# Patient Record
Sex: Male | Born: 1945 | Race: Black or African American | Hispanic: No | Marital: Single | Smoking: Former smoker
Health system: Southern US, Community
[De-identification: ages and names within clinical notes are randomized; demographics above are authoritative.]

## PROBLEM LIST (undated history)

## (undated) DIAGNOSIS — R972 Elevated prostate specific antigen [PSA]: Secondary | ICD-10-CM

## (undated) DIAGNOSIS — I10 Essential (primary) hypertension: Secondary | ICD-10-CM

## (undated) DIAGNOSIS — K219 Gastro-esophageal reflux disease without esophagitis: Secondary | ICD-10-CM

## (undated) DIAGNOSIS — N401 Enlarged prostate with lower urinary tract symptoms: Secondary | ICD-10-CM

## (undated) DIAGNOSIS — E119 Type 2 diabetes mellitus without complications: Secondary | ICD-10-CM

## (undated) DIAGNOSIS — R339 Retention of urine, unspecified: Secondary | ICD-10-CM

## (undated) HISTORY — DX: Gastro-esophageal reflux disease without esophagitis: K21.9

## (undated) HISTORY — DX: Retention of urine, unspecified: R33.9

## (undated) HISTORY — DX: Benign prostatic hyperplasia with lower urinary tract symptoms: N40.1

## (undated) HISTORY — DX: Elevated prostate specific antigen (PSA): R97.20

## (undated) HISTORY — DX: Essential (primary) hypertension: I10

---

## 2008-12-13 HISTORY — PX: HAMMER TOE SURGERY: SHX385

## 2014-10-31 ENCOUNTER — Emergency Department: Payer: Self-pay | Admitting: Emergency Medicine

## 2014-10-31 LAB — COMPREHENSIVE METABOLIC PANEL
ALT: 22 U/L
ANION GAP: 8 (ref 7–16)
Albumin: 3.3 g/dL — ABNORMAL LOW (ref 3.4–5.0)
Alkaline Phosphatase: 83 U/L
BUN: 15 mg/dL (ref 7–18)
Bilirubin,Total: 0.6 mg/dL (ref 0.2–1.0)
CHLORIDE: 108 mmol/L — AB (ref 98–107)
Calcium, Total: 9.5 mg/dL (ref 8.5–10.1)
Co2: 24 mmol/L (ref 21–32)
Creatinine: 1.21 mg/dL (ref 0.60–1.30)
EGFR (Non-African Amer.): >60
Glucose: 125 mg/dL — ABNORMAL HIGH (ref 65–99)
POTASSIUM: 4.1 mmol/L (ref 3.5–5.1)
SGOT(AST): 22 U/L (ref 15–37)
Sodium: 140 mmol/L (ref 136–145)
Total Protein: 7.2 g/dL (ref 6.4–8.2)

## 2014-10-31 LAB — CBC
HCT: 50.7 % (ref 40.0–52.0)
HGB: 17 g/dL (ref 13.0–18.0)
MCH: 31.6 pg (ref 26.0–34.0)
MCHC: 33.4 g/dL (ref 32.0–36.0)
MCV: 95 fL (ref 80–100)
PLATELETS: 219 10*3/uL (ref 150–440)
RBC: 5.36 10*6/uL (ref 4.40–5.90)
RDW: 13.5 % (ref 11.5–14.5)
WBC: 6.1 10*3/uL (ref 3.8–10.6)

## 2014-10-31 LAB — TROPONIN I: Troponin-I: <0.02 ng/mL

## 2014-12-13 ENCOUNTER — Emergency Department: Payer: Self-pay | Admitting: Emergency Medicine

## 2014-12-13 LAB — COMPREHENSIVE METABOLIC PANEL
ALBUMIN: 4.1 g/dL (ref 3.4–5.0)
ALT: 29 U/L
ANION GAP: 10 (ref 7–16)
AST: 26 U/L (ref 15–37)
Alkaline Phosphatase: 79 U/L
BILIRUBIN TOTAL: 0.8 mg/dL (ref 0.2–1.0)
BUN: 10 mg/dL (ref 7–18)
Calcium, Total: 9.5 mg/dL (ref 8.5–10.1)
Chloride: 102 mmol/L (ref 98–107)
Co2: 24 mmol/L (ref 21–32)
Creatinine: 0.96 mg/dL (ref 0.60–1.30)
EGFR (Non-African Amer.): >60
GLUCOSE: 109 mg/dL — AB (ref 65–99)
Osmolality: 272 (ref 275–301)
POTASSIUM: 3.9 mmol/L (ref 3.5–5.1)
Sodium: 136 mmol/L (ref 136–145)
TOTAL PROTEIN: 7.6 g/dL (ref 6.4–8.2)

## 2014-12-13 LAB — URINALYSIS, COMPLETE
BACTERIA: NONE SEEN
Bilirubin,UR: NEGATIVE
Leukocyte Esterase: NEGATIVE
NITRITE: NEGATIVE
Ph: 5 (ref 4.5–8.0)
Specific Gravity: 1.012 (ref 1.003–1.030)
Squamous Epithelial: NONE SEEN
WBC UR: 1 /HPF (ref 0–5)

## 2014-12-13 LAB — CBC
HCT: 48 % (ref 40.0–52.0)
HGB: 15.8 g/dL (ref 13.0–18.0)
MCH: 30.8 pg (ref 26.0–34.0)
MCHC: 32.8 g/dL (ref 32.0–36.0)
MCV: 94 fL (ref 80–100)
Platelet: 217 10*3/uL (ref 150–440)
RBC: 5.11 10*6/uL (ref 4.40–5.90)
RDW: 13.1 % (ref 11.5–14.5)
WBC: 8 10*3/uL (ref 3.8–10.6)

## 2015-05-21 ENCOUNTER — Encounter: Payer: Self-pay | Admitting: *Deleted

## 2015-05-22 ENCOUNTER — Ambulatory Visit (INDEPENDENT_AMBULATORY_CARE_PROVIDER_SITE_OTHER): Payer: Medicare Other | Admitting: Urology

## 2015-05-22 ENCOUNTER — Encounter: Payer: Self-pay | Admitting: Urology

## 2015-05-22 VITALS — BP 149/86 | HR 86 | Ht 73.0 in | Wt 174.5 lb

## 2015-05-22 DIAGNOSIS — N401 Enlarged prostate with lower urinary tract symptoms: Secondary | ICD-10-CM | POA: Diagnosis not present

## 2015-05-22 DIAGNOSIS — R972 Elevated prostate specific antigen [PSA]: Secondary | ICD-10-CM

## 2015-05-22 DIAGNOSIS — N138 Other obstructive and reflux uropathy: Secondary | ICD-10-CM

## 2015-05-22 NOTE — Progress Notes (Signed)
05/22/2015 7:16 PM   Xavier Moore [birth year] [id number]  Referring provider: No referring provider defined for this encounter.  Chief Complaint  Patient presents with  . Benign Prostatic Hypertrophy    Here to discuss having a prostate biopsy    HPI: [name] is a 69 year old African-American male with an elevated PSA. Patient presented to [place] in February after being seen in the emergency department for urinary retention. He had indwelling Foley placed during his ER visit. It was removed in our office for a voiding trial he was started on Flomax. He was experiencing urinary frequency, urgency, weak stream and nocturia, getting up 3 times per night before the episode of retention. He states since he started the Flomax he has frequency going to the bathroom every 3 hours while awake and nocturia once per night. Patient had a PSA drawn on 01/13/2015 and returned 18.0 ng/mL. The elevated PSA may have been the result of his episode of urinary retention, so he was asked to return in 3 months to have his PSA repeated. He returned on 04/21/2015 and his PSA was 12.3 ng/mL at that time. He is also found to have an irregular prostate on exam.  He presents today to discuss prostate biopsy. I will repeat PSA and DRE today.     PMH: Past Medical History  Diagnosis Date  . GERD (gastroesophageal reflux disease)   . Hypertension   . Urinary retention   . Benign prostatic hypertrophy with lower urinary tract symptoms (LUTS)   . Elevated PSA     Surgical History: Past Surgical History  Procedure Laterality Date  . Hammer toe surgery  2010    Home Medications:    Medication List       This list is accurate as of: 05/22/15 11:59 PM.  Always use your most recent med list.               lisinopril 20 MG tablet  Commonly known as:  PRINIVIL,ZESTRIL  Take 20 mg by mouth daily.     omeprazole 20 MG capsule  Commonly known as:  PRILOSEC  Take 20 mg by mouth daily.     tamsulosin  0.4 MG Caps capsule  Commonly known as:  FLOMAX  Take 0.4 mg by mouth daily.        Allergies: No Known Allergies  Family History: Family History  Problem Relation Age of Onset  . Cancer Neg Hx     Kidney,Prostate, Bladder    Social History:  reports that he has quit smoking. He does not have any smokeless tobacco history on file. He reports that he drinks alcohol. He reports that he does not use illicit drugs.  ROS: Urological Symptom Review  Patient is experiencing the following symptoms: Frequent urination Get up at night to urinate Leakage of urine Weak stream   Review of Systems  Gastrointestinal (upper)  : Negative for upper GI symptoms  Gastrointestinal (lower) : Negative for lower GI symptoms  Constitutional : Negative for symptoms  Skin: Negative for skin symptoms  Eyes: Negative for eye symptoms  Ear/Nose/Throat : Negative for Ear/Nose/Throat symptoms  Hematologic/Lymphatic: Negative for Hematologic/Lymphatic symptoms  Cardiovascular : Negative for cardiovascular symptoms  Respiratory : Negative for respiratory symptoms  Endocrine: Negative for endocrine symptoms  Musculoskeletal: Negative for musculoskeletal symptoms  Neurological: Negative for neurological symptoms  Psychologic: Negative for psychiatric symptoms   Physical Exam: BP 149/86 mmHg  Pulse 86  Ht 6\' 1"  (1.854 m)  Wt 174 lb  8 oz (79.153 kg)  BMI 23.03 kg/m2   Rectal: Patient with  normal sphincter tone. Perineum without scarring or rashes. No rectal masses are appreciated. Prostate is approximately 55 grams, with rubbery nodule.  Seminal vesicles are normal.   Laboratory Data: Results for orders placed or performed in visit on 05/22/15  PSA  Result Value Ref Range   Prostate Specific Ag, Serum 14.8 (H) 0.0 - 4.0 ng/mL   Lab Results  Component Value Date   WBC 8.0 12/13/2014   HGB 15.8 12/13/2014   HCT 48.0 12/13/2014   MCV 94 12/13/2014   PLT 217  12/13/2014    Lab Results  Component Value Date   CREATININE 0.96 12/13/2014    No results found for: PSA  No results found for: TESTOSTERONE  No results found for: HGBA1C  Urinalysis No results found for: COLORURINE, APPEARANCEUR, LABSPEC, PHURINE, GLUCOSEU, HGBUR, BILIRUBINUR, KETONESUR, PROTEINUR, UROBILINOGEN, NITRITE, LEUKOCYTESUR  Pertinent Imaging:  Assessment & Plan:   1. Elevated PSA-  If PSA remains elevated, patient will be schedule for a TRUSPBx of prostate.  The procedure is explained and the risks involved, such as blood in urine, blood in stool, blood in semen, infection, urinary retention, and on rare occasions sepsis and death.  Patient understands the risks as explained to him and he wishes to proceed.  - PSA  2. BPH with LUTS-  Patient's lower urinary tract symptoms are well-controlled with tamsulosin 0.4 mg 1 by mouth daily.   He will continue that medication.  If prostate biopsy is negative, he will follow up IPSS and PVR.Marland Kitchen   No Follow-up on file.  Michiel Cowboy, PA-C  [facility] [address] [place], [place] [address] [phone]

## 2015-05-23 ENCOUNTER — Telehealth: Payer: Self-pay

## 2015-05-23 LAB — PSA: PROSTATE SPECIFIC AG, SERUM: 14.8 ng/mL — AB (ref 0.0–4.0)

## 2015-05-23 NOTE — Telephone Encounter (Signed)
-----   Message from Harle Battiest, PA-C sent at 05/23/2015  8:49 AM EDT ----- PSA has not changed.  We need to proceed with the TRUSPBx of prostate.  Confirm that he is not taking any blood thinners.

## 2015-05-23 NOTE — Telephone Encounter (Signed)
Spoke with pt who agreed to prostate bx. Pt also stated he is not taking any blood thinners. Attempted to set pt up for prostate bx. Pt c/o running up minutes of cell phone and stated he would call back this afternoon to set up bx appt. Cw,lpn

## 2015-05-23 NOTE — Telephone Encounter (Signed)
-----   Message from Harle Battiest, PA-C sent at 05/23/2015  8:52 AM EDT ----- Scheduled a TRUSPBx of prostate.  Confirm he is not on blood thinners.

## 2015-05-25 DIAGNOSIS — R972 Elevated prostate specific antigen [PSA]: Secondary | ICD-10-CM | POA: Insufficient documentation

## 2015-05-25 DIAGNOSIS — N401 Enlarged prostate with lower urinary tract symptoms: Principal | ICD-10-CM

## 2015-05-25 DIAGNOSIS — N138 Other obstructive and reflux uropathy: Secondary | ICD-10-CM | POA: Insufficient documentation

## 2015-05-26 ENCOUNTER — Telehealth: Payer: Self-pay

## 2015-05-26 NOTE — Telephone Encounter (Signed)
Pt came into the office with questions and  Concerns regarding prostate bx. All questions were answered. Pt scheduled prostate bx prior to leaving facility. Cw,lpn

## 2015-06-27 ENCOUNTER — Ambulatory Visit: Payer: Medicare Other | Admitting: Urology

## 2015-06-27 VITALS — Ht 73.0 in | Wt 175.4 lb

## 2015-06-27 MED ORDER — LEVOFLOXACIN 500 MG PO TABS: 500.0000 mg | ORAL_TABLET | Freq: Once | ORAL | Status: DC

## 2015-06-27 MED ORDER — GENTAMICIN SULFATE 40 MG/ML IJ SOLN
80.0000 mg | Freq: Once | INTRAMUSCULAR | Status: DC
Start: 2015-06-27 — End: 2015-07-22

## 2015-07-08 ENCOUNTER — Ambulatory Visit: Payer: Medicare Other | Admitting: Urology

## 2015-07-22 ENCOUNTER — Ambulatory Visit (INDEPENDENT_AMBULATORY_CARE_PROVIDER_SITE_OTHER): Payer: Medicare Other | Admitting: Urology

## 2015-07-22 ENCOUNTER — Other Ambulatory Visit: Payer: Self-pay | Admitting: Urology

## 2015-07-22 VITALS — Ht 73.0 in | Wt 177.9 lb

## 2015-07-22 DIAGNOSIS — R972 Elevated prostate specific antigen [PSA]: Secondary | ICD-10-CM

## 2015-07-22 MED ORDER — GENTAMICIN SULFATE 40 MG/ML IJ SOLN
80.0000 mg | Freq: Once | INTRAMUSCULAR | Status: AC
  Administered 2015-07-22: 80 mg via INTRAMUSCULAR

## 2015-07-22 MED ORDER — LIDOCAINE HCL 2 % EX GEL
1.0000 "application " | Freq: Once | CUTANEOUS | Status: AC
  Administered 2015-07-22: 1 via URETHRAL

## 2015-07-22 MED ORDER — LEVOFLOXACIN 500 MG PO TABS
500.0000 mg | ORAL_TABLET | Freq: Once | ORAL | Status: AC
  Administered 2015-07-22: 500 mg via ORAL

## 2015-07-22 NOTE — Progress Notes (Signed)
Prostate Biopsy Procedure   Informed consent was obtained after discussing risks/benefits of the procedure.  A time out was performed to ensure correct patient identity.  Pre-Procedure: - Last PSA Level:  Lab Results  Component Value Date   PSA 14.8* 05/22/2015   - Gentamicin given prophylactically - Levaquin 500 mg administered PO -Transrectal Ultrasound performed revealing a 56 gm prostate - Large adenomas within transitional zone noted, small hypoechoic area on left apex.  Small median lobe.  Procedure: - Prostate block performed using 10 cc 1% lidocaine and biopsies taken from sextant areas, a total of 12 under ultrasound guidance.  Post-Procedure: - Patient tolerated the procedure well - He was counseled to seek immediate medical attention if experiences any severe pain, significant bleeding, or fevers - Return in one week to discuss biopsy results

## 2015-07-25 ENCOUNTER — Other Ambulatory Visit: Payer: Self-pay | Admitting: Urology

## 2015-07-25 LAB — PATHOLOGY REPORT

## 2016-01-26 ENCOUNTER — Encounter: Payer: Self-pay | Admitting: Urology

## 2016-01-26 ENCOUNTER — Ambulatory Visit: Payer: Medicare Other | Admitting: Urology

## 2016-02-05 ENCOUNTER — Encounter: Payer: Self-pay | Admitting: Urology

## 2016-02-05 ENCOUNTER — Ambulatory Visit (INDEPENDENT_AMBULATORY_CARE_PROVIDER_SITE_OTHER): Payer: Medicare Other | Admitting: Urology

## 2016-02-05 VITALS — BP 123/79 | HR 76 | Ht 73.0 in | Wt 174.6 lb

## 2016-02-05 DIAGNOSIS — R3989 Other symptoms and signs involving the genitourinary system: Secondary | ICD-10-CM

## 2016-02-05 DIAGNOSIS — N138 Other obstructive and reflux uropathy: Secondary | ICD-10-CM

## 2016-02-05 DIAGNOSIS — R972 Elevated prostate specific antigen [PSA]: Secondary | ICD-10-CM

## 2016-02-05 DIAGNOSIS — Z87898 Personal history of other specified conditions: Secondary | ICD-10-CM

## 2016-02-05 DIAGNOSIS — N528 Other male erectile dysfunction: Secondary | ICD-10-CM | POA: Diagnosis not present

## 2016-02-05 DIAGNOSIS — N401 Enlarged prostate with lower urinary tract symptoms: Secondary | ICD-10-CM | POA: Diagnosis not present

## 2016-02-05 DIAGNOSIS — N529 Male erectile dysfunction, unspecified: Secondary | ICD-10-CM

## 2016-02-05 DIAGNOSIS — Z87448 Personal history of other diseases of urinary system: Secondary | ICD-10-CM | POA: Diagnosis not present

## 2016-02-05 LAB — BLADDER SCAN AMB NON-IMAGING: Scan Result: 24

## 2016-02-05 MED ORDER — FINASTERIDE 5 MG PO TABS: 5.0000 mg | ORAL_TABLET | Freq: Every day | ORAL | Status: DC

## 2016-02-05 NOTE — Progress Notes (Signed)
10:14 AM   Xavier Moore [birth year] [id number]  Referring provider: Oswaldo Conroy, MD [address] [place], [place] [address]  Chief Complaint  Patient presents with  . Benign Prostatic Hypertrophy    6 month follow up    HPI: Patient is a 70 year old African-American male with a history of elevated PSA, irregular prostate exam, history of urinary retention, BPH with LUTS and ED who presents today for a 6 month follow up.   History of elevated PSA Patient was found to have an elevated PSA of 18.0 after an episode of urinary retention in 01/2015.  His PSA did not reduce significantly after repeated blood tests and he underwent a biopsy on 07/22/2015 for a PSA of 14.8 with Dr. Marlou Porch.  The biopsy did not demonstrate any malignancy.    Irregular prostate exam Patient was found to have a rubbery nodule in his left lobe of his prostate.  It has remained unchanged.  He did undergo a biopsy in 07/2015 and no malignancy was found.  History of urinary retention Patient had an episode of acute urinary retention in 01/2015.  He had a foley in place for bladder decompression and started on tamsulosin 0.4 mg daily.  He has been voiding successfully since that time.  His PVR today is 24 mL.    BPH WITH LUTS His IPSS score today is 12, which is moderate lower urinary tract symptomatology. He is mixed with his quality life due to his urinary symptoms. His PVR is 24 mL.  His major complaint today is urinary frequency.  He has had these symptoms for the last few months.  He does admit to drinking 4 to 6 cups of coffee during the day.    He denies any dysuria, hematuria or suprapubic pain.  He currently taking tamsulosin 0.4 mg daily.    He also denies any recent fevers, chills, nausea or vomiting.  He does not have a family history of PCa.      IPSS      02/05/16 0900       International Prostate Symptom Score   How often have you had the sensation of not emptying  your bladder? Less than half the time     How often have you had to urinate less than every two hours? About half the time     How often have you found you stopped and started again several times when you urinated? Less than half the time     How often have you found it difficult to postpone urination? Not at All     How often have you had a weak urinary stream? Almost always     How often have you had to strain to start urination? Not at All     How many times did you typically get up at night to urinate? None     Total IPSS Score 12     Quality of Life due to urinary symptoms   If you were to spend the rest of your life with your urinary condition just the way it is now how would you feel about that? Mixed        Score:  1-7 Mild 8-19 Moderate 20-35 Severe  Erectile dysfunction His SHIM score is 21, which is mild.   He has been having difficulty with erections for last several years.   His major complaint is achieving an erection.  His libido is preserved.   His risk factors  for ED are age, BPH and HTN.  He denies any painful erections or curvatures with his erections.       SHIM      02/05/16 0955       SHIM: Over the last 6 months:   How do you rate your confidence that you could get and keep an erection? Moderate     When you had erections with sexual stimulation, how often were your erections hard enough for penetration (entering your partner)? Sometimes (about half the time)     During sexual intercourse, how often were you able to maintain your erection after you had penetrated (entered) your partner? Not Difficult     During sexual intercourse, how difficult was it to maintain your erection to completion of intercourse? Not Difficult     When you attempted sexual intercourse, how often was it satisfactory for you? Not Difficult     SHIM Total Score   SHIM 21        Score: 1-7 Severe ED 8-11 Moderate ED 12-16 Mild-Moderate ED 17-21 Mild ED 22-25 No ED  PMH: Past  Medical History  Diagnosis Date  . GERD (gastroesophageal reflux disease)   . Hypertension   . Urinary retention   . Benign prostatic hypertrophy with lower urinary tract symptoms (LUTS)   . Elevated PSA     Surgical History: Past Surgical History  Procedure Laterality Date  . Hammer toe surgery  2010    Home Medications:    Medication List       This list is accurate as of: 02/05/16 10:14 AM.  Always use your most recent med list.               finasteride 5 MG tablet  Commonly known as:  PROSCAR  Take 1 tablet (5 mg total) by mouth daily.     lisinopril 20 MG tablet  Commonly known as:  PRINIVIL,ZESTRIL  Take 20 mg by mouth daily.     omeprazole 20 MG capsule  Commonly known as:  PRILOSEC  Take 20 mg by mouth daily.     tamsulosin 0.4 MG Caps capsule  Commonly known as:  FLOMAX  Take 0.4 mg by mouth daily.     triamcinolone cream 0.1 %  Commonly known as:  KENALOG  Reported on 02/05/2016        Allergies: No Known Allergies  Family History: Family History  Problem Relation Age of Onset  . Cancer Neg Hx     Kidney,Prostate, Bladder    Social History:  reports that he has quit smoking. He does not have any smokeless tobacco history on file. He reports that he drinks alcohol. He reports that he uses illicit drugs (Marijuana).  ROS: Urological Symptom Review  Patient is experiencing the following symptoms: Frequent urination Get up at night to urinate Leakage of urine Weak stream   Review of Systems  Gastrointestinal (upper)  : Negative for upper GI symptoms  Gastrointestinal (lower) : Negative for lower GI symptoms  Constitutional : Negative for symptoms  Skin: Negative for skin symptoms  Eyes: Negative for eye symptoms  Ear/Nose/Throat : Negative for Ear/Nose/Throat symptoms  Hematologic/Lymphatic: Negative for Hematologic/Lymphatic symptoms  Cardiovascular : Negative for cardiovascular symptoms  Respiratory : Negative for  respiratory symptoms  Endocrine: Negative for endocrine symptoms  Musculoskeletal: Negative for musculoskeletal symptoms  Neurological: Negative for neurological symptoms  Psychologic: Negative for psychiatric symptoms   Physical Exam: BP 123/79 mmHg  Pulse 76  Ht  (1.854  m)  Wt 174 lb 9.6 oz (79.198 kg)  BMI 23.04 kg/m2  Constitutional: Well nourished. Alert and oriented, No acute distress. HEENT: [place] AT, moist mucus membranes. Trachea midline, no masses. Cardiovascular: No clubbing, cyanosis, or edema. Respiratory: Normal respiratory effort, no increased work of breathing. GI: Abdomen is soft, non tender, non distended, no abdominal masses. Liver and spleen not palpable.  No hernias appreciated.  Stool sample for occult testing is not indicated.   GU: No CVA tenderness.  No bladder fullness or masses.  Patient with uncircumcised phallus. Foreskin easily retracted  Urethral meatus is patent.  No penile discharge. No penile lesions or rashes. Glands with vitiligo.  Scrotum without lesions, cysts, rashes and/or edema.  Testicles are located scrotally bilaterally. No masses are appreciated in the testicles. Left and right epididymis are normal. Rectal: Patient with  normal sphincter tone. Anus and perineum without scarring or rashes. No rectal masses are appreciated. Prostate is approximately 60 grams, a rubbery nodule is appreciated in the left lobe.  Unchanged from previous exams. Seminal vesicles are normal. Skin: No rashes, bruises or suspicious lesions. Lymph: No cervical or inguinal adenopathy. Neurologic: Grossly intact, no focal deficits, moving all 4 extremities. Psychiatric: Normal mood and affect.    Laboratory Data:  Lab Results  Component Value Date   WBC 8.0 12/13/2014   HGB 15.8 12/13/2014   HCT 48.0 12/13/2014   MCV 94 12/13/2014   PLT 217 12/13/2014    Lab Results  Component Value Date   CREATININE 0.96 12/13/2014    PSA History  18.0 ng/mL on  01/13/2015  12.3 ng/mL on 04/21/2015  14.8 ng/mL on 05/22/2015  Pertinent Imaging: Results for Xavier Moore, Xavier Moore (MRN [id number]) as of 02/05/2016 13:07  Ref. Range 02/05/2016 10:16  Scan Result Unknown 24   Assessment & Plan:   1. History of elevated PSA:   Patient was found to have an elevated PSA of 18.0 after an episode of urinary retention in 01/2015.  His PSA did not reduce significantly after repeated blood tests and he underwent a biopsy on 07/22/2015 for a PSA of 14.8 with Dr. Marlou Porch.  The biopsy did not demonstrate any malignancy.  PSA is obtained today.  If it remains at baseline, he will RTC in 6 months for a PSA and exam.  If it returns elevated, I will discuss the next steps with one of our physicians.  2. Irregular prostate exam:   Patient has a rubbery nodule in his left lobe.  He was biopsied in 07/2015 and no malignancies were found.  We will continue to monitor with exam and PSA in 6 months if PSA remains at baseline.    3. History of urinary retention:   Patient had an episode of urinary retention in 01/2015.  He was started on tamsulosin 0.4 mg daily.  He has been voiding well.  His PVR was 24 mL.  He will be returning in 6 months for an IPSS score and exam if PSA remains at baseline.  4.  BPH with LUTS:   Patient's IPSS score is 12/3.  His PVR 24 mL.  His prostate is moderately enlarged.  I will add finasteride 5 mg daily.  The side effects of finasteride are also discussed with the patient, such as: impotence, loss of interest in sex, or trouble having an orgasm; abnormal ejaculation; swelling in his hands and/or feet; swelling and/or tenderness in his breasts.  He will continue the tamsulosin 0.4 mg daily.  He will follow up  in 6 months for a PSA, exam and an IPSS score if PSA remains at baseline.     5. Erectile dysfunction:   SHIM score is 21.  We will continue to monitor.  He will be returning in 6 months for a SHIM score and exam if his PSA remains at  baseline.  Return in about 6 months (around 08/04/2016) for IPSS score, SHIM score and exam.  Michiel Cowboy, [facility]  [facility] [address], [address] [place], [place] [address] [phone]

## 2016-02-06 LAB — PSA: PROSTATE SPECIFIC AG, SERUM: 8.9 ng/mL — AB (ref 0.0–4.0)

## 2016-02-10 ENCOUNTER — Telehealth: Payer: Self-pay

## 2016-02-10 NOTE — Telephone Encounter (Signed)
Spoke with pt in reference to PSA results. Pt voiced understanding.  

## 2016-02-10 NOTE — Telephone Encounter (Signed)
-----   Message from Harle Battiest, PA-C sent at 02/06/2016  3:09 PM EST ----- PSA is decreased.  We will see him in 6 months.

## 2016-02-15 ENCOUNTER — Other Ambulatory Visit: Payer: Self-pay | Admitting: Obstetrics and Gynecology

## 2016-02-15 DIAGNOSIS — N400 Enlarged prostate without lower urinary tract symptoms: Secondary | ICD-10-CM

## 2016-02-16 ENCOUNTER — Other Ambulatory Visit: Payer: Self-pay | Admitting: Obstetrics and Gynecology

## 2016-02-16 DIAGNOSIS — N400 Enlarged prostate without lower urinary tract symptoms: Secondary | ICD-10-CM

## 2016-03-07 IMAGING — CR DG CHEST 2V
1 series · 2 of 2 positions shown · non-contrast
Comparison: None.

CLINICAL DATA: Left-sided pleuritic chest pain.  Recent fall.

EXAM:
CHEST  2 VIEW

[Series 1: dxr chest pa (or ap) and lateral · 0.14mm/px · 2 of 2 slices shown]
[im 1/2]
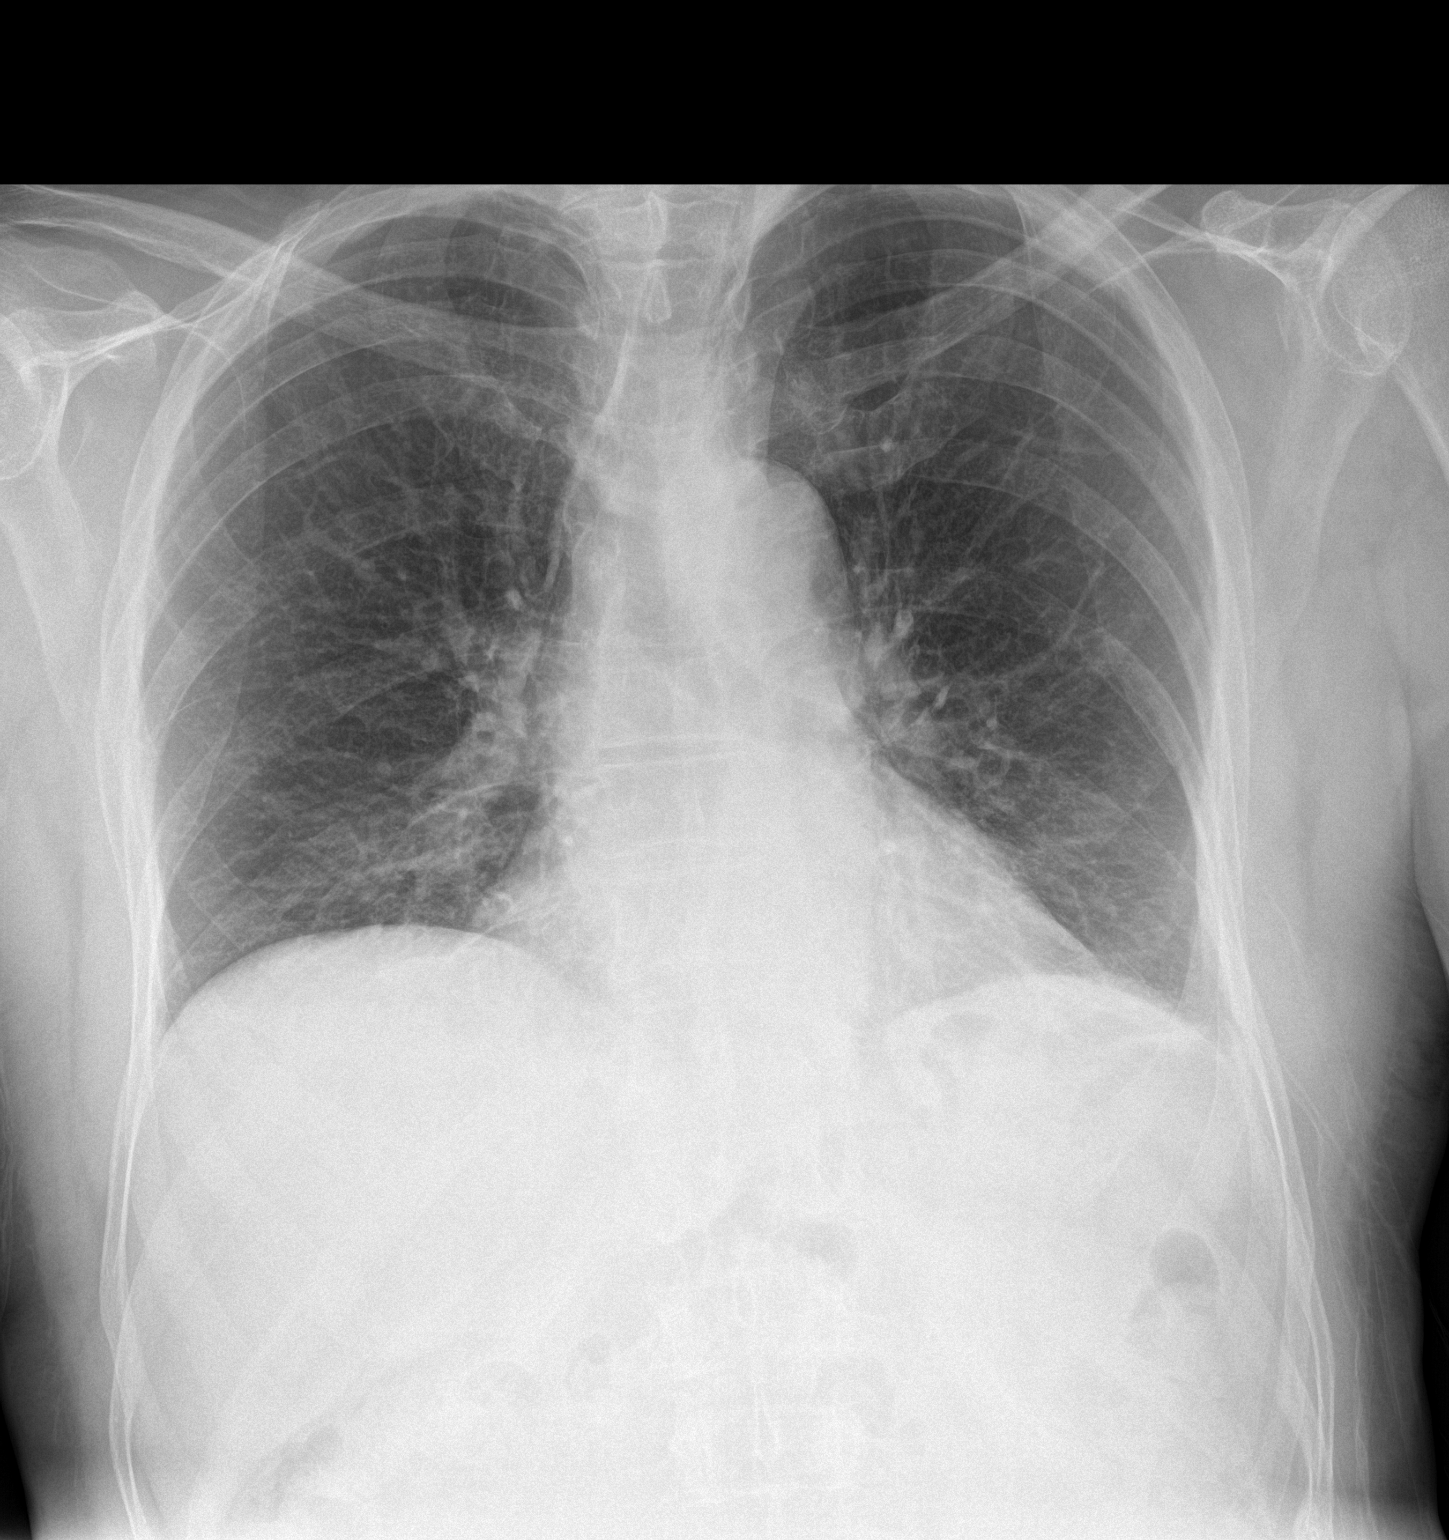
[im 2/2]
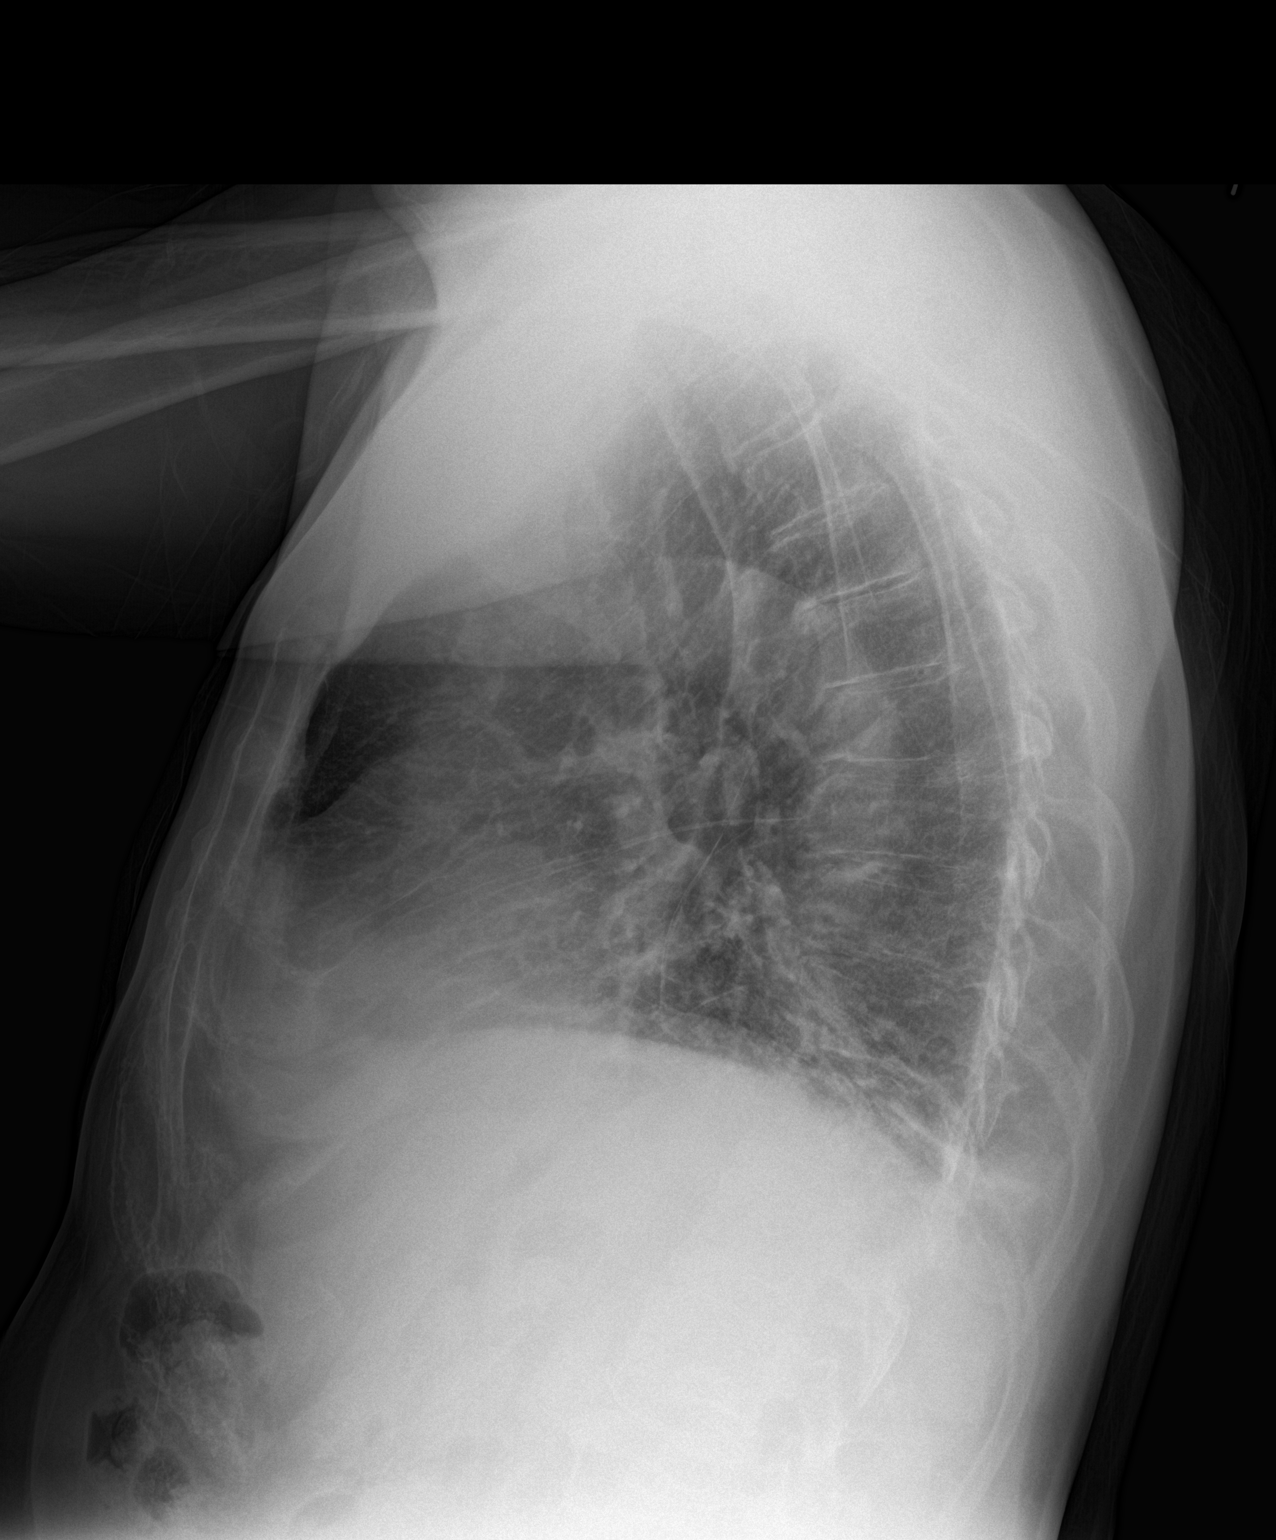

[2 of 2 positions shown; findings below may reference images not displayed]

FINDINGS: Mild linear opacity in left lower lobe may be due to atelectasis or
scarring. No evidence of pulmonary airspace disease or edema. No
evidence of pneumothorax or pleural effusion. Heart size and
mediastinal contours are within normal limits.
IMPRESSION: Mild left lower lobe atelectasis versus scarring. No evidence of
pneumothorax or pleural effusion.

## 2016-03-17 ENCOUNTER — Other Ambulatory Visit: Payer: Self-pay

## 2016-03-19 NOTE — Telephone Encounter (Signed)
ERROR

## 2016-03-31 ENCOUNTER — Ambulatory Visit (INDEPENDENT_AMBULATORY_CARE_PROVIDER_SITE_OTHER): Payer: Medicare Other | Admitting: Podiatry

## 2016-03-31 ENCOUNTER — Ambulatory Visit (INDEPENDENT_AMBULATORY_CARE_PROVIDER_SITE_OTHER): Payer: Medicare Other

## 2016-03-31 ENCOUNTER — Encounter: Payer: Self-pay | Admitting: Podiatry

## 2016-03-31 VITALS — BP 141/63 | HR 70 | Resp 16

## 2016-03-31 DIAGNOSIS — M79676 Pain in unspecified toe(s): Secondary | ICD-10-CM

## 2016-03-31 DIAGNOSIS — M2040 Other hammer toe(s) (acquired), unspecified foot: Secondary | ICD-10-CM | POA: Diagnosis not present

## 2016-03-31 DIAGNOSIS — Q828 Other specified congenital malformations of skin: Secondary | ICD-10-CM | POA: Diagnosis not present

## 2016-03-31 DIAGNOSIS — B351 Tinea unguium: Secondary | ICD-10-CM

## 2016-03-31 NOTE — Progress Notes (Signed)
   Subjective:    Patient ID: Xavier Moore, male    DOB: 04/24/1946, 70 y.o.   MRN: [id number]  HPI : [name] presents today after many years of pain 2 forefoot bilaterally. He states that he's had surgery to his left foot in the past with the deformities recurred several years after completion of his surgery he states that the surgery took so long he would not entertain that again at this point. He states that his feet hurt so bad that he can hardly walk he finds itself sitting in front of his television the majority of the time. He states that he would like to have some medication he would alleviate the pain to allow him to walk. He's been seeing another podiatrist in the past he would only trim his nails and calluses. They would not give him pain pills.    Review of Systems  All other systems reviewed and are negative.      Objective:   Physical Exam : vital signs stable alert and oriented 3 in no apparent distress. Presents walking with an antalgic gait. Pulses are palpable neurologic sensorium is intact deep tendon reflexes are intact muscle strength is normal bilateral. Orthopedic evaluation demonstrates cavus foot deformity bilateral mild. Moderate to severe hammertoe deformity right with plantar flexion of the first and fourth metatarsals right foot. Left foot does demonstrate surgical toes that are malformed and never completely healed normally. He also has bunion deformities left greater than right. Radiographs confirm internal fixation second and third metatarsal heads of the left foot and less than corrected hammertoe repairs left. Moderate to severe hammertoe deformities right foot with non-complicated structures. No fractures identified. Cutaneous evaluation demonstrates reactive fibrokeratoma splinter aspects of the bilateral foot and elongated painful clinically mycotic nails. No open lesions or wounds.        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 Pain in limb secondary to onychomycosis.  Plan: Debridement of toenails 1 through 5 bilateral. Debridement of all reactive hyperkeratoses. I would not provide him with oral medication such as a narcotic. All for him anti-inflammatories and gabapentin which he declined. I also offered orthotics which he declined stating that he did not have those kinds of pockets. I expressed to him that we were not able to treat long-term pain based on the lateral walls governed by the DEA. I suggested that he follow-up with his primary doctor who would more than likely refer him to pain management should that doctor feel that it warrants narcotics.

## 2016-04-08 ENCOUNTER — Ambulatory Visit: Admit: 2016-04-08 | Payer: Self-pay | Admitting: Gastroenterology

## 2016-04-08 SURGERY — COLONOSCOPY WITH PROPOFOL
Anesthesia: Choice

## 2016-04-26 ENCOUNTER — Encounter: Payer: Self-pay | Admitting: *Deleted

## 2016-04-27 ENCOUNTER — Encounter: Admission: RE | Payer: Self-pay | Source: Ambulatory Visit

## 2016-04-27 ENCOUNTER — Ambulatory Visit: Admission: RE | Admit: 2016-04-27 | Payer: Medicare Other | Source: Ambulatory Visit | Admitting: Gastroenterology

## 2016-04-27 SURGERY — COLONOSCOPY WITH PROPOFOL
Anesthesia: General

## 2016-07-27 ENCOUNTER — Other Ambulatory Visit: Payer: Self-pay

## 2016-07-27 DIAGNOSIS — N400 Enlarged prostate without lower urinary tract symptoms: Secondary | ICD-10-CM

## 2016-07-27 MED ORDER — TAMSULOSIN HCL 0.4 MG PO CAPS: 0.4000 mg | ORAL_CAPSULE | Freq: Every day | ORAL | 3 refills | Status: DC

## 2016-07-28 ENCOUNTER — Other Ambulatory Visit: Payer: Medicare Other

## 2016-08-04 ENCOUNTER — Ambulatory Visit: Payer: Medicare Other | Admitting: Urology

## 2016-08-17 ENCOUNTER — Other Ambulatory Visit: Payer: Self-pay

## 2016-08-17 DIAGNOSIS — N400 Enlarged prostate without lower urinary tract symptoms: Secondary | ICD-10-CM

## 2016-08-18 ENCOUNTER — Other Ambulatory Visit: Payer: Medicare Other

## 2016-08-18 DIAGNOSIS — N400 Enlarged prostate without lower urinary tract symptoms: Secondary | ICD-10-CM

## 2016-08-19 LAB — PSA: PROSTATE SPECIFIC AG, SERUM: 3.5 ng/mL (ref 0.0–4.0)

## 2016-08-23 ENCOUNTER — Encounter: Payer: Self-pay | Admitting: Urology

## 2016-08-23 ENCOUNTER — Ambulatory Visit (INDEPENDENT_AMBULATORY_CARE_PROVIDER_SITE_OTHER): Payer: Medicare Other | Admitting: Urology

## 2016-08-23 VITALS — BP 134/89 | HR 83 | Ht 73.0 in | Wt 186.1 lb

## 2016-08-23 DIAGNOSIS — N528 Other male erectile dysfunction: Secondary | ICD-10-CM | POA: Diagnosis not present

## 2016-08-23 DIAGNOSIS — N401 Enlarged prostate with lower urinary tract symptoms: Secondary | ICD-10-CM | POA: Diagnosis not present

## 2016-08-23 DIAGNOSIS — R3989 Other symptoms and signs involving the genitourinary system: Secondary | ICD-10-CM

## 2016-08-23 DIAGNOSIS — N138 Other obstructive and reflux uropathy: Secondary | ICD-10-CM

## 2016-08-23 DIAGNOSIS — R972 Elevated prostate specific antigen [PSA]: Secondary | ICD-10-CM

## 2016-08-23 DIAGNOSIS — N529 Male erectile dysfunction, unspecified: Secondary | ICD-10-CM

## 2016-08-23 NOTE — Progress Notes (Signed)
9:12 AM   Beryl Meager Rayo [birth year] [id number]  Referring provider: Oswaldo Conroy, MD [address] [place], [place] [address]  Chief Complaint  Patient presents with  . Benign Prostatic Hypertrophy    6 month follow up  . Elevated PSA    HPI: Patient is a 70 year old African-American male with a history of elevated PSA, irregular prostate exam, history of urinary retention, BPH with LUTS and ED who presents today for a 6 month follow up.   History of elevated PSA Patient was found to have an elevated PSA of 18.0 after an episode of urinary retention in 01/2015.  His PSA did not reduce significantly after repeated blood tests and he underwent a biopsy on 07/22/2015 for a PSA of 14.8 with Dr. Marlou Porch.  The biopsy did not demonstrate any malignancy.  Since then started on finasteride. His current PSA is 3.5 on 08/18/2016.  Irregular prostate exam Patient was found to have a rubbery nodule in his left lobe of his prostate.  It has remained unchanged.  He did undergo a biopsy in 07/2015 and no malignancy was found.  History of urinary retention Patient had an episode of acute urinary retention in 01/2015.  He had a foley in place for bladder decompression and started on tamsulosin 0.4 mg daily.   He is also taking finasteride 5 mg daily. He has been voiding successfully since that time.    BPH WITH LUTS His IPSS score today is 7, which is mild lower urinary tract symptomatology. He is mostly satisfied with his quality life due to his urinary symptoms. His previous IPSS score was 12/2.  His previous PVR was 24 mL.  His major complaint today is urinary frequency.  He has had these symptoms for the last several months.  He is still drinking 4 to 6 cups of coffee during the day.    He denies any dysuria, hematuria or suprapubic pain.  He currently taking tamsulosin 0.4 mg daily and finasteride 5 mg daily.    He also denies any recent fevers, chills, nausea or vomiting.   He does not have a family history of PCa.      IPSS    Row Name 08/23/16 0800         International Prostate Symptom Score   How often have you had the sensation of not emptying your bladder? Less than 1 in 5     How often have you had to urinate less than every two hours? Almost always     How often have you found you stopped and started again several times when you urinated? Not at All     How often have you found it difficult to postpone urination? Not at All     How often have you had a weak urinary stream? Not at All     How often have you had to strain to start urination? Not at All     How many times did you typically get up at night to urinate? 1 Time     Total IPSS Score 7       Quality of Life due to urinary symptoms   If you were to spend the rest of your life with your urinary condition just the way it is now how would you feel about that? Mostly Satisfied        Score:  1-7 Mild 8-19 Moderate 20-35 Severe   Erectile dysfunction His SHIM score is 15, which  is mild to moderate ED.  His major complaint is lack of firmness.  His previous SHIM score was 21.  He has been having difficulty with erections for last several years.   His major complaint is achieving an erection.  His libido is preserved.   His risk factors for ED are age, BPH, marijuana and HTN.  He denies any painful erections or curvatures with his erections.       SHIM    Row Name 08/23/16 0850         SHIM: Over the last 6 months:   How do you rate your confidence that you could get and keep an erection? Moderate     When you had erections with sexual stimulation, how often were your erections hard enough for penetration (entering your partner)? Sometimes (about half the time)     During sexual intercourse, how often were you able to maintain your erection after you had penetrated (entered) your partner? Difficult     During sexual intercourse, how difficult was it to maintain your erection to  completion of intercourse? Difficult     When you attempted sexual intercourse, how often was it satisfactory for you? Difficult       SHIM Total Score   SHIM 15        Score: 1-7 Severe ED 8-11 Moderate ED 12-16 Mild-Moderate ED 17-21 Mild ED 22-25 No ED  PMH: Past Medical History:  Diagnosis Date  . Benign prostatic hypertrophy with lower urinary tract symptoms (LUTS)   . Elevated PSA   . GERD (gastroesophageal reflux disease)   . Hypertension   . Urinary retention     Surgical History: Past Surgical History:  Procedure Laterality Date  . HAMMER TOE SURGERY  2010    Home Medications:    Medication List       Accurate as of 08/23/16  9:12 AM. Always use your most recent med list.          finasteride 5 MG tablet Commonly known as:  PROSCAR Take 1 tablet (5 mg total) by mouth daily.   lisinopril 20 MG tablet Commonly known as:  PRINIVIL,ZESTRIL Take 20 mg by mouth daily.   omeprazole 20 MG capsule Commonly known as:  PRILOSEC Take 20 mg by mouth daily.   tamsulosin 0.4 MG Caps capsule Commonly known as:  FLOMAX Take 1 capsule (0.4 mg total) by mouth daily.       Allergies: No Known Allergies  Family History: Family History  Problem Relation Age of Onset  . Kidney disease Brother     2 on Diaysis  . Cancer Neg Hx     Kidney,Prostate, Bladder    Social History:  reports that he has quit smoking. He has never used smokeless tobacco. He reports that he drinks alcohol. He reports that he uses drugs, including Marijuana.  ROS: UROLOGY Frequent Urination?: No Hard to postpone urination?: No Burning/pain with urination?: No Get up at night to urinate?: No Leakage of urine?: No Urine stream starts and stops?: No Trouble starting stream?: No Do you have to strain to urinate?: No Blood in urine?: No Urinary tract infection?: No Sexually transmitted disease?: No Injury to kidneys or bladder?: No Painful intercourse?: No Weak stream?:  No Erection problems?: Yes Penile pain?: No Gastrointestinal Nausea?: No Vomiting?: No Indigestion/heartburn?: No Diarrhea?: No Constipation?: No Constitutional Fever: No Night sweats?: No Weight loss?: No Fatigue?: No Skin Skin rash/lesions?: No Itching?: No Eyes Blurred vision?: No Double vision?: No Ears/Nose/Throat  Sore throat?: No Sinus problems?: No Hematologic/Lymphatic Swollen glands?: No Easy bruising?: No Cardiovascular Leg swelling?: No Chest pain?: No Respiratory Cough?: No Shortness of breath?: No Endocrine Excessive thirst?: No Musculoskeletal Back pain?: No Joint pain?: No Neurological Headaches?: No Dizziness?: No Psychologic Depression?: No Anxiety?: No     Physical Exam: BP 134/89   Pulse 83   Ht 6\' 1"  (1.854 m)   Wt 186 lb 1.6 oz (84.4 kg)   BMI 24.55 kg/m   Constitutional: Well nourished. Alert and oriented, No acute distress. HEENT: [place] AT, moist mucus membranes. Trachea midline, no masses. Cardiovascular: No clubbing, cyanosis, or edema. Respiratory: Normal respiratory effort, no increased work of breathing. GI: Abdomen is soft, non tender, non distended, no abdominal masses. Liver and spleen not palpable.  No hernias appreciated.  Stool sample for occult testing is not indicated.   GU: No CVA tenderness.  No bladder fullness or masses.  Patient with uncircumcised phallus. Foreskin easily retracted  Urethral meatus is patent.  No penile discharge. No penile lesions or rashes. Glands with vitiligo.  Scrotum without lesions, cysts, rashes and/or edema.  Testicles are located scrotally bilaterally. No masses are appreciated in the testicles. Left and right epididymis are normal. Rectal: Patient with  normal sphincter tone. Anus and perineum without scarring or rashes. No rectal masses are appreciated. Prostate is approximately 60 grams, a rubbery nodule is appreciated in the left lobe.  Unchanged from previous exams. Seminal vesicles are  normal. Skin: No rashes, bruises or suspicious lesions. Lymph: No cervical or inguinal adenopathy. Neurologic: Grossly intact, no focal deficits, moving all 4 extremities. Psychiatric: Normal mood and affect.    Laboratory Data:  Lab Results  Component Value Date   WBC 8.0 12/13/2014   HGB 15.8 12/13/2014   HCT 48.0 12/13/2014   MCV 94 12/13/2014   PLT 217 12/13/2014    Lab Results  Component Value Date   CREATININE 0.96 12/13/2014    PSA History  18.0 ng/mL on 01/13/2015  12.3 ng/mL on 04/21/2015  14.8 ng/mL on 05/22/2015    3.5 ng/mL on 08/18/2016  Assessment & Plan:   1. History of elevated PSA:   Patient was found to have an elevated PSA of 18.0 after an episode of urinary retention in 01/2015.  His PSA did not reduce significantly after repeated blood tests and he underwent a biopsy on 07/22/2015 for a PSA of 14.8 with Dr. Marlou PorchHerrick.  The biopsy did not demonstrate any malignancy.  Recent PSA was 3.5.  RTC in 6 months for repeat PSA.    2. Irregular prostate exam:   Patient has a rubbery nodule in his left lobe.  He was biopsied in 07/2015 and no malignancies were found.  We will continue to monitor with exam and PSA in 6 months if PSA remains at baseline.    3. History of urinary retention:   Resolved.  4. BPH with LUTS  - IPSS score is 7/2, it is improving  - Continue conservative management, avoiding bladder irritants and timed voiding's  - Continue tamsulosin 0.4 mg daily and finasteride 5 mg daily  - RTC in 6 months for IPSS, PSA and exam   5. Erectile dysfunction:   SHIM score is 15.   I explained to the patient that in order to achieve an erection it takes good functioning of the nervous system (parasympathetic, sympathetic, sensory and motor), good blood flow into the erectile tissue of the penis and a desire to have sex.   I  stated that conditions like diabetes, hypertension, coronary artery disease, peripheral vascular disease, smoking, alcohol consumption,  age and BPH can diminish the ability to have an erection.   We discussed trying a PDE5 inhibitor, intra-urethral suppositories, intracavernous vasoactive drug injection therapy, vacuum constriction device and penile prosthesis implantation.    - Patient would like to try Viagra, 100 mg samples are given, He is warned not to take the Viagra with medications that contain nitrates.  I also advised him of the side effects, such as: headache, flushing, dyspepsia, abnormal vision, nasal congestion, back pain, myalgia, nausea, dizziness, and rash.  - RTC in 6 months for repeat SHIM score and exam  Return in about 6 months (around 02/20/2017) for IPSS, SHIM, PSA and exam.  Michiel Cowboy, [facility]  [facility] [address] [place], [place] [address] [phone]

## 2016-09-09 ENCOUNTER — Telehealth: Payer: Self-pay | Admitting: Urology

## 2016-09-09 DIAGNOSIS — N529 Male erectile dysfunction, unspecified: Secondary | ICD-10-CM

## 2016-09-09 MED ORDER — SILDENAFIL CITRATE 100 MG PO TABS: 100.0000 mg | ORAL_TABLET | Freq: Every day | ORAL | 0 refills | Status: AC | PRN

## 2016-09-09 NOTE — Telephone Encounter (Signed)
Pt asking for a refill for Viagra he thinks its 100mg   Please Advise

## 2016-09-09 NOTE — Telephone Encounter (Signed)
Refill given

## 2016-11-15 ENCOUNTER — Other Ambulatory Visit: Payer: Self-pay | Admitting: Urology

## 2016-11-15 DIAGNOSIS — N400 Enlarged prostate without lower urinary tract symptoms: Secondary | ICD-10-CM

## 2016-12-20 ENCOUNTER — Telehealth: Payer: Self-pay | Admitting: Urology

## 2016-12-20 DIAGNOSIS — R972 Elevated prostate specific antigen [PSA]: Secondary | ICD-10-CM

## 2016-12-20 DIAGNOSIS — N401 Enlarged prostate with lower urinary tract symptoms: Secondary | ICD-10-CM

## 2016-12-20 MED ORDER — FINASTERIDE 5 MG PO TABS: 5.0000 mg | ORAL_TABLET | Freq: Every day | ORAL | 3 refills | Status: AC

## 2016-12-20 NOTE — Telephone Encounter (Signed)
Pt called and has moved to [place].  He needs someone to call pharmacy about his Finasteride RX.  He now uses [facility] on [address] [phone].

## 2016-12-20 NOTE — Telephone Encounter (Signed)
Medication sent to new pharmacy

## 2017-02-11 ENCOUNTER — Other Ambulatory Visit: Payer: Self-pay

## 2017-02-11 DIAGNOSIS — R972 Elevated prostate specific antigen [PSA]: Secondary | ICD-10-CM

## 2017-02-14 ENCOUNTER — Other Ambulatory Visit: Payer: Medicare Other

## 2017-02-18 ENCOUNTER — Other Ambulatory Visit: Payer: Medicare Other

## 2017-02-20 NOTE — Progress Notes (Deleted)
5:54 PM   Xavier Moore 09-Sep-1946 [id number]  Referring provider: Oswaldo Conroy, MD [address] [place], [place] [address]  No chief complaint on file.   HPI: Patient is a 71 year old African-American male with a history of elevated PSA, irregular prostate exam, history of urinary retention, BPH with LUTS and ED who presents today for a 6 month follow up.   History of elevated PSA Patient was found to have an elevated PSA of 18.0 after an episode of urinary retention in 01/2015.  His PSA did not reduce significantly after repeated blood tests and he underwent a biopsy on 07/22/2015 for a PSA of 14.8 with Dr. Marlou Porch.  The biopsy did not demonstrate any malignancy.  Since then started on finasteride. His current PSA is 3.5 on 08/18/2016.  Irregular prostate exam Patient was found to have a rubbery nodule in his left lobe of his prostate.  It has remained unchanged.  He did undergo a biopsy in 07/2015 and no malignancy was found.  History of urinary retention Patient had an episode of acute urinary retention in 01/2015.  He had a foley in place for bladder decompression and started on tamsulosin 0.4 mg daily.   He is also taking finasteride 5 mg daily. He has been voiding successfully since that time.    BPH WITH LUTS His IPSS score today is ***, which is *** lower urinary tract symptomatology. He is *** with his quality life due to his urinary symptoms. His previous IPSS score was 7/2.  His previous PVR was 24 mL.  His major complaint today is urinary frequency.  He has had these symptoms for the last several months.  He is still drinking 4 to 6 cups of coffee during the day.    He denies any dysuria, hematuria or suprapubic pain.  He currently taking tamsulosin 0.4 mg daily and finasteride 5 mg daily.    He also denies any recent fevers, chills, nausea or vomiting.  He does not have a family history of PCa.    Score:  1-7 Mild 8-19 Moderate 20-35  Severe   Erectile dysfunction His SHIM score is ***, which is *** ED.  His major complaint is lack of firmness.  His previous SHIM score was 15.  He has been having difficulty with erections for last several years.   His major complaint is achieving an erection.  His libido is preserved.   His risk factors for ED are age, BPH, marijuana and HTN.  He denies any painful erections or curvatures with his erections.     Score: 1-7 Severe ED 8-11 Moderate ED 12-16 Mild-Moderate ED 17-21 Mild ED 22-25 No ED  PMH: Past Medical History:  Diagnosis Date  . Benign prostatic hypertrophy with lower urinary tract symptoms (LUTS)   . Elevated PSA   . GERD (gastroesophageal reflux disease)   . Hypertension   . Urinary retention     Surgical History: Past Surgical History:  Procedure Laterality Date  . HAMMER TOE SURGERY  2010    Home Medications:  Allergies as of 02/21/2017   No Known Allergies     Medication List       Accurate as of 02/20/17  5:54 PM. Always use your most recent med list.          finasteride 5 MG tablet Commonly known as:  PROSCAR Take 1 tablet (5 mg total) by mouth daily.   lisinopril 20 MG tablet Commonly known as:  PRINIVIL,ZESTRIL Take 20 mg by mouth daily.   omeprazole 20 MG capsule Commonly known as:  PRILOSEC Take 20 mg by mouth daily.   sildenafil 100 MG tablet Commonly known as:  VIAGRA Take 1 tablet (100 mg total) by mouth daily as needed for erectile dysfunction.   tamsulosin 0.4 MG Caps capsule Commonly known as:  FLOMAX TAKE ONE CAPSULE BY MOUTH ONCE DAILY       Allergies: No Known Allergies  Family History: Family History  Problem Relation Age of Onset  . Kidney disease Brother     2 on Diaysis  . Cancer Neg Hx     Kidney,Prostate, Bladder    Social History:  reports that he has quit smoking. He has never used smokeless tobacco. He reports that he drinks alcohol. He reports that he uses drugs, including Marijuana.  ROS:        Physical Exam: There were no vitals taken for this visit.  Constitutional: Well nourished. Alert and oriented, No acute distress. HEENT: [place] AT, moist mucus membranes. Trachea midline, no masses. Cardiovascular: No clubbing, cyanosis, or edema. Respiratory: Normal respiratory effort, no increased work of breathing. GI: Abdomen is soft, non tender, non distended, no abdominal masses. Liver and spleen not palpable.  No hernias appreciated.  Stool sample for occult testing is not indicated.   GU: No CVA tenderness.  No bladder fullness or masses.  Patient with uncircumcised phallus. Foreskin easily retracted  Urethral meatus is patent.  No penile discharge. No penile lesions or rashes. Glands with vitiligo.  Scrotum without lesions, cysts, rashes and/or edema.  Testicles are located scrotally bilaterally. No masses are appreciated in the testicles. Left and right epididymis are normal. Rectal: Patient with  normal sphincter tone. Anus and perineum without scarring or rashes. No rectal masses are appreciated. Prostate is approximately 60 grams, a rubbery nodule is appreciated in the left lobe.  Unchanged from previous exams. Seminal vesicles are normal. Skin: No rashes, bruises or suspicious lesions. Lymph: No cervical or inguinal adenopathy. Neurologic: Grossly intact, no focal deficits, moving all 4 extremities. Psychiatric: Normal mood and affect.    Laboratory Data:  Lab Results  Component Value Date   WBC 8.0 12/13/2014   HGB 15.8 12/13/2014   HCT 48.0 12/13/2014   MCV 94 12/13/2014   PLT 217 12/13/2014    Lab Results  Component Value Date   CREATININE 0.96 12/13/2014    PSA History  18.0 ng/mL on 01/13/2015  12.3 ng/mL on 04/21/2015  14.8 ng/mL on 05/22/2015    3.5 ng/mL on 08/18/2016  Assessment & Plan:   1. History of elevated PSA:   Patient was found to have an elevated PSA of 18.0 after an episode of urinary retention in 01/2015.  His PSA did not reduce  significantly after repeated blood tests and he underwent a biopsy on 07/22/2015 for a PSA of 14.8 with Dr. Marlou PorchHerrick.  The biopsy did not demonstrate any malignancy.  Recent PSA was 3.5.  PSA drawn today.  RTC in 6 months for PSA and exam if PSA remains stable.    2. Irregular prostate exam:   Patient has a rubbery nodule in his left lobe.  He was biopsied in 07/2015 and no malignancies were found.  We will continue to monitor with exam and PSA in 6 months if PSA remains at baseline.    3. History of urinary retention:   Resolved.  4. BPH with LUTS  - IPSS score is 7/2, it is improving  -  Continue conservative management, avoiding bladder irritants and timed voiding's  - Continue tamsulosin 0.4 mg daily and finasteride 5 mg daily  - RTC in 6 months for IPSS, PSA and exam   5. Erectile dysfunction:   SHIM score is 15.   I explained to the patient that in order to achieve an erection it takes good functioning of the nervous system (parasympathetic, sympathetic, sensory and motor), good blood flow into the erectile tissue of the penis and a desire to have sex.   I stated that conditions like diabetes, hypertension, coronary artery disease, peripheral vascular disease, smoking, alcohol consumption, age and BPH can diminish the ability to have an erection.   We discussed trying a PDE5 inhibitor, intra-urethral suppositories, intracavernous vasoactive drug injection therapy, vacuum constriction device and penile prosthesis implantation.    - Patient would like to try Viagra, 100 mg samples are given, He is warned not to take the Viagra with medications that contain nitrates.  I also advised him of the side effects, such as: headache, flushing, dyspepsia, abnormal vision, nasal congestion, back pain, myalgia, nausea, dizziness, and rash.  - RTC in 6 months for repeat SHIM score and exam  No Follow-up on file.  Michiel Cowboy, PA-C  [facility] [address] [place], [place] [address] [phone]

## 2017-02-21 ENCOUNTER — Ambulatory Visit: Payer: Medicare Other | Admitting: Urology

## 2017-05-24 ENCOUNTER — Other Ambulatory Visit: Payer: Self-pay | Admitting: Urology

## 2017-05-24 DIAGNOSIS — N400 Enlarged prostate without lower urinary tract symptoms: Secondary | ICD-10-CM

## 2018-04-01 ENCOUNTER — Telehealth: Payer: Self-pay | Admitting: Urology

## 2018-04-01 NOTE — Telephone Encounter (Signed)
Patient is overdue for an office visit. He needs a PSA and a DRE.

## 2023-01-19 ENCOUNTER — Encounter: Payer: Self-pay | Admitting: Urology

## 2023-01-20 ENCOUNTER — Encounter: Payer: Medicare (Managed Care) | Admitting: Urology

## 2023-02-01 ENCOUNTER — Encounter: Payer: Self-pay | Admitting: Urology

## 2023-02-01 ENCOUNTER — Ambulatory Visit (INDEPENDENT_AMBULATORY_CARE_PROVIDER_SITE_OTHER): Payer: Medicare (Managed Care) | Admitting: Urology

## 2023-02-01 VITALS — BP 162/101 | HR 88 | Ht 73.0 in | Wt 173.0 lb

## 2023-02-01 DIAGNOSIS — N401 Enlarged prostate with lower urinary tract symptoms: Secondary | ICD-10-CM | POA: Diagnosis not present

## 2023-02-01 DIAGNOSIS — R972 Elevated prostate specific antigen [PSA]: Secondary | ICD-10-CM

## 2023-02-01 LAB — URINALYSIS
Bilirubin, UA: NEGATIVE
Glucose, UA: >=1000 mg/dL — AB
Ketones, POC UA: NEGATIVE mg/dL
Leukocytes, UA: NEGATIVE
Nitrite, UA: NEGATIVE
Protein Ur, POC: NEGATIVE mg/dL
Spec Grav, UA: 1.015 (ref 1.010–1.025)
Urobilinogen, UA: 0.2 E.U./dL
pH, UA: 6.5 (ref 5.0–8.0)

## 2023-02-01 NOTE — Progress Notes (Signed)
Assessment: 1. Elevated PSA   2. Benign localized prostatic hyperplasia with lower urinary tract symptoms (LUTS)      Plan: Today I had a long discussion with the patient regarding his elevated PSA, recent finding of prostate nodule and recommendation for prostate biopsy.  I discussed with him the nature of the procedure today in detail including potential adverse events and complications.  Patient agrees to proceed.  Will schedule next available.  Chief Complaint: elevated psa  History of Present Illness:  Xavier Moore is a 77 y.o. male who is seen in consultation from Letta Median, MD for evaluation of elevated PSA and BPH. [facility] has been followed by [facility] for a number of years.  Today I reviewed his prior records.  Patient has a history of elevated PSA and underwent TRUS/BX 07/2015 for a PSA of 14.8 biopsy was negative for malignancy.  Patient also has longstanding lower urinary tract symptoms and following his negative biopsy he was started on combination medical therapy with tamsulosin and finasteride. Patient currently reports stable LUTS.  IPSS = 10  Patient also has a history of erectile dysfunction previously on Viagra.  He is no longer sexually active.  Patient was recently seen by Dr. Abner Greenspan in follow-up.  His PSA was noted to rise to 13.8.  Prior to that his last PSA was 9.9 01/2020.  On DRE he also noted a right-sided suspicious nodule.  He recommended to proceed with repeat biopsy.  Unfortunately there was a problem with the patient's insurance and he presents here now for further evaluation and treatment.   Past Medical History:  Past Medical History:  Diagnosis Date   Benign prostatic hypertrophy with lower urinary tract symptoms (LUTS)    Elevated PSA    GERD (gastroesophageal reflux disease)    Hypertension    Urinary retention     Past Surgical History:  Past Surgical History:  Procedure Laterality Date   HAMMER TOE SURGERY  2010     Allergies:  No Known Allergies  Family History:  Family History  Problem Relation Age of Onset   Kidney disease Brother        2 on Diaysis   Cancer Neg Hx        Kidney,Prostate, Bladder    Social History:  Social History   Tobacco Use   Smoking status: Former   Smokeless tobacco: Never   Tobacco comments:    quit 2 years ago  Substance Use Topics   Alcohol use: Yes    Alcohol/week: 0.0 standard drinks of alcohol    Comment: derate   Drug use: Yes    Types: Marijuana    Review of symptoms:  Constitutional:  Negative for unexplained weight loss, night sweats, fever, chills ENT:  Negative for nose bleeds, sinus pain, painful swallowing CV:  Negative for chest pain, shortness of breath, exercise intolerance, palpitations, loss of consciousness Resp:  Negative for cough, wheezing, shortness of breath GI:  Negative for nausea, vomiting, diarrhea, bloody stools GU:  Positives noted in HPI; otherwise negative for gross hematuria, dysuria, urinary incontinence Neuro:  Negative for seizures, poor balance, limb weakness, slurred speech Psych:  Negative for lack of energy, depression, anxiety Endocrine:  Negative for polydipsia, polyuria, symptoms of hypoglycemia (dizziness, hunger, sweating) Hematologic:  Negative for anemia, purpura, petechia, prolonged or excessive bleeding, use of anticoagulants  Allergic:  Negative for difficulty breathing or choking as a result of exposure to anything; no shellfish allergy; no allergic response (rash/itch) to  materials, foods  Physical exam: BP (!) 162/101   Pulse 88   Ht 6' 1"$  (1.854 m)   Wt 173 lb (78.5 kg)   BMI 22.82 kg/m  GENERAL APPEARANCE:  Well appearing, well developed, well nourished, NAD

## 2023-02-01 NOTE — Addendum Note (Signed)
Addended by: Dema Severin on: 02/01/2023 11:09 AM   Modules accepted: Orders

## 2023-02-01 NOTE — Patient Instructions (Signed)
Prostate Biopsy Instructions  Stop all aspirin or blood thinners (aspirin, plavix, coumadin, warfarin, motrin, ibuprofen, advil, aleve, naproxen, naprosyn) for 7 days prior to the procedure.  If you have any questions about stopping these medications, please contact your primary care physician or cardiologist.  Having a light meal prior to the procedure is recommended.  If you are diabetic or have low blood sugar please bring a small snack or glucose tablet.  A Fleets enema is needed and can be purchased over the counter at a local pharmacy. This will need to be administered 2 hours prior to your procedure. Antibiotics will be administered in the clinic at the time of the procedure unless otherwise specified.    If you have any questions or concerns, please feel free to call the office at [phone] or send a Mychart message.   Thank you,  Staff at [facility]

## 2023-02-07 ENCOUNTER — Telehealth: Payer: Self-pay

## 2023-02-07 NOTE — Telephone Encounter (Signed)
Spoke with patient to make sure he did not have any questions prior to his procedure on Wednesday. Answered all questions and patient verbalized understanding.

## 2023-02-09 ENCOUNTER — Ambulatory Visit (HOSPITAL_BASED_OUTPATIENT_CLINIC_OR_DEPARTMENT_OTHER)
Admission: RE | Admit: 2023-02-09 | Discharge: 2023-02-09 | Disposition: A | Discharge status: Home / Self Care | Payer: Medicare (Managed Care) | Source: Ambulatory Visit | Attending: Urology | Admitting: Urology

## 2023-02-09 ENCOUNTER — Ambulatory Visit (INDEPENDENT_AMBULATORY_CARE_PROVIDER_SITE_OTHER): Payer: Medicare (Managed Care) | Admitting: Urology

## 2023-02-09 ENCOUNTER — Encounter: Payer: Self-pay | Admitting: Urology

## 2023-02-09 VITALS — BP 149/87 | HR 101 | Ht 73.0 in | Wt 173.0 lb

## 2023-02-09 DIAGNOSIS — R972 Elevated prostate specific antigen [PSA]: Secondary | ICD-10-CM

## 2023-02-09 MED ORDER — CEFTRIAXONE SODIUM 1 G IJ SOLR
1.0000 g | Freq: Once | INTRAMUSCULAR | Status: AC
  Administered 2023-02-09: 1 g via INTRAMUSCULAR

## 2023-02-09 NOTE — Progress Notes (Signed)
   Assessment: 1. Elevated PSA     Plan: Patient will return in about 1-2 weeks to review path and further recommendations.  Chief Complaint: Patient presents today for planned TRUS/BX   HPI: Xavier Moore is a 77 y.o. male who presents planned TRUS/BX   Portions of the above documentation were copied from a prior visit for review purposes only.  Allergies: No Known Allergies  PMH: Past Medical History:  Diagnosis Date   Benign prostatic hypertrophy with lower urinary tract symptoms (LUTS)    Elevated PSA    GERD (gastroesophageal reflux disease)    Hypertension    Urinary retention     PSH: Past Surgical History:  Procedure Laterality Date   HAMMER TOE SURGERY  2010    SH: Social History   Tobacco Use   Smoking status: Former   Smokeless tobacco: Never   Tobacco comments:    quit 2 years ago  Substance Use Topics   Alcohol use: Yes    Alcohol/week: 0.0 standard drinks of alcohol    Comment: derate   Drug use: Yes    Types: Marijuana    ROS: Constitutional:  Negative for fever, chills, weight loss CV: Negative for chest pain, previous MI, hypertension Respiratory:  Negative for shortness of breath, wheezing, sleep apnea, frequent cough GI:  Negative for nausea, vomiting, bloody stool, GERD  PE: Vitals:   02/09/23 1346  BP: (!) 149/87  Pulse: (!) 101      Results: Ua NEG FOR UTI     TRANSRECTAL ULTRASOUND AND PROSTATE BIOPSY  Indication:  Elevated PSA (13.8 on finasteride) and abnl DRE  Prophylactic antibiotic administration: Rocephin  All medications that could result in increased bleeding were discontinued within an appropriate period of the time of biopsy.  Risk including bleeding and infection were discussed.  Informed consent was obtained.  The patient was placed in the left lateral decubitus position.  DRE: 60gm gland.  No definite nodules or induration.    PROCEDURE 1.  TRANSRECTAL ULTRASOUND OF THE PROSTATE  The 7 MHz  transrectal probe was used to image the prostate.  Anal stenosis was not noted.  TRUS volume: 58 ml  Hypoechoic areas: no obvious PZ lesions.  Several TX nodules probably related to BPH  Hyperechoic areas: none  Central calcifications: present extensively L>R  Margins:  normal  Seminal Vesicles: normal   PROCEDURE 2:  PROSTATE BIOPSY  A periprostatic block was performed using 1% lidocaine and transrectal ultrasound guidance. Under transrectal ultrasound guidance, and using the Biopty gun, prostate biopsies were obtained systematically from the apex, mid gland, and base bilaterally.  A total of 12 cores were obtained.  Hemostasis was obtained with gentle pressure on the prostate.  The procedures were well-tolerated.  No significant bleeding was noted at the end of the procedure.  The patient was stable for discharge from the office.

## 2023-02-14 ENCOUNTER — Encounter: Payer: Self-pay | Admitting: Urology

## 2023-02-17 ENCOUNTER — Ambulatory Visit (INDEPENDENT_AMBULATORY_CARE_PROVIDER_SITE_OTHER): Payer: Medicare (Managed Care) | Admitting: Urology

## 2023-02-17 ENCOUNTER — Encounter: Payer: Self-pay | Admitting: Urology

## 2023-02-17 VITALS — BP 144/89 | HR 91 | Ht 73.0 in | Wt 183.0 lb

## 2023-02-17 DIAGNOSIS — N401 Enlarged prostate with lower urinary tract symptoms: Secondary | ICD-10-CM

## 2023-02-17 DIAGNOSIS — R972 Elevated prostate specific antigen [PSA]: Secondary | ICD-10-CM

## 2023-02-17 NOTE — Addendum Note (Signed)
Addended by: Evelina Bucy on: 02/17/2023 04:01 PM   Modules accepted: Orders

## 2023-02-17 NOTE — Progress Notes (Addendum)
   Assessment: 1. Elevated PSA   2. Benign localized prostatic hyperplasia with lower urinary tract symptoms (LUTS)     Plan: Today had a long discussion with the patient regarding the biopsy results which showed no evidence of cancer.  The patient does have significant PSA elevation especially when factoring in his finasteride.  I have recommended that he return in approximately 4 months and we will have an iso-PSA performed prior to his visit.  IF this is concerning for high risk disease would then proceed with prostate MRI.  TOTAL TIME providing care during visit today   ADDENDUM 06/15/23-- IsoPSA  05/2023--- 4.0 (risk cutpoint >6)  total psa 4.8   Chief Complaint: Biopsy results  HPI: Xavier Moore is a 77 y.o. male who presents for continued evaluation of BPH and elevated psa.  He is s/p trus/bx last week. Please see my note 02/01/2023 at the time of initial visit for detailed history and exam. Today Xavier Moore reports no complications as a result of his recent prostate biopsy. Pathology was benign.   Portions of the above documentation were copied from a prior visit for review purposes only.  Allergies: No Known Allergies  PMH: Past Medical History:  Diagnosis Date   Benign prostatic hypertrophy with lower urinary tract symptoms (LUTS)    Elevated PSA    GERD (gastroesophageal reflux disease)    Hypertension    Urinary retention     PSH: Past Surgical History:  Procedure Laterality Date   HAMMER TOE SURGERY  2010    SH: Social History   Tobacco Use   Smoking status: Former   Smokeless tobacco: Never   Tobacco comments:    quit 2 years ago  Substance Use Topics   Alcohol use: Yes    Alcohol/week: 0.0 standard drinks of alcohol    Comment: derate   Drug use: Yes    Types: Marijuana    ROS: Constitutional:  Negative for fever, chills, weight loss CV: Negative for chest pain, previous MI, hypertension Respiratory:  Negative for shortness of  breath, wheezing, sleep apnea, frequent cough GI:  Negative for nausea, vomiting, bloody stool, GERD  PE: There were no vitals taken for this visit. GENERAL APPEARANCE:  Well appearing, well developed, well nourished, NAD

## 2023-06-07 ENCOUNTER — Other Ambulatory Visit: Payer: Medicare (Managed Care)

## 2023-06-20 ENCOUNTER — Encounter: Payer: Self-pay | Admitting: Urology

## 2023-06-21 ENCOUNTER — Ambulatory Visit: Payer: Medicare (Managed Care) | Admitting: Urology

## 2024-05-31 ENCOUNTER — Encounter (HOSPITAL_COMMUNITY): Payer: Self-pay

## 2024-05-31 ENCOUNTER — Other Ambulatory Visit: Payer: Self-pay

## 2024-05-31 ENCOUNTER — Emergency Department (HOSPITAL_COMMUNITY)
Admission: EM | Admit: 2024-05-31 | Discharge: 2024-05-31 | Disposition: A | Discharge status: Home / Self Care | Payer: Medicare (Managed Care) | Attending: Emergency Medicine | Admitting: Emergency Medicine

## 2024-05-31 DIAGNOSIS — E119 Type 2 diabetes mellitus without complications: Secondary | ICD-10-CM | POA: Diagnosis not present

## 2024-05-31 DIAGNOSIS — Z7984 Long term (current) use of oral hypoglycemic drugs: Secondary | ICD-10-CM | POA: Diagnosis not present

## 2024-05-31 DIAGNOSIS — T671XXA Heat syncope, initial encounter: Secondary | ICD-10-CM

## 2024-05-31 DIAGNOSIS — R55 Syncope and collapse: Secondary | ICD-10-CM | POA: Diagnosis present

## 2024-05-31 DIAGNOSIS — I10 Essential (primary) hypertension: Secondary | ICD-10-CM | POA: Diagnosis not present

## 2024-05-31 DIAGNOSIS — Z79899 Other long term (current) drug therapy: Secondary | ICD-10-CM | POA: Diagnosis not present

## 2024-05-31 DIAGNOSIS — R030 Elevated blood-pressure reading, without diagnosis of hypertension: Secondary | ICD-10-CM

## 2024-05-31 LAB — COMPREHENSIVE METABOLIC PANEL WITH GFR
ALT: 11 U/L (ref 0–44)
AST: 14 U/L — ABNORMAL LOW (ref 15–41)
Albumin: 3.5 g/dL (ref 3.5–5.0)
Alkaline Phosphatase: 47 U/L (ref 38–126)
Anion gap: 7 (ref 5–15)
BUN: 13 mg/dL (ref 8–23)
CO2: 26 mmol/L (ref 22–32)
Calcium: 10 mg/dL (ref 8.9–10.3)
Chloride: 104 mmol/L (ref 98–111)
Creatinine, Ser: 0.97 mg/dL (ref 0.61–1.24)
GFR, Estimated: >60 mL/min (ref >60)
Glucose, Bld: 161 mg/dL — ABNORMAL HIGH (ref 70–99)
Potassium: 3.6 mmol/L (ref 3.5–5.1)
Sodium: 137 mmol/L (ref 135–145)
Total Bilirubin: 0.6 mg/dL (ref 0.0–1.2)
Total Protein: 7.2 g/dL (ref 6.5–8.1)

## 2024-05-31 LAB — CBC WITH DIFFERENTIAL/PLATELET
Abs Immature Granulocytes: 0.02 10*3/uL (ref 0.00–0.07)
Basophils Absolute: 0 10*3/uL (ref 0.0–0.1)
Basophils Relative: 0 %
Eosinophils Absolute: 0 10*3/uL (ref 0.0–0.5)
Eosinophils Relative: 0 %
HCT: 43.2 % (ref 39.0–52.0)
Hemoglobin: 14.2 g/dL (ref 13.0–17.0)
Immature Granulocytes: 0 %
Lymphocytes Relative: 18 %
Lymphs Abs: 1.3 10*3/uL (ref 0.7–4.0)
MCH: 29.5 pg (ref 26.0–34.0)
MCHC: 32.9 g/dL (ref 30.0–36.0)
MCV: 89.6 fL (ref 80.0–100.0)
Monocytes Absolute: 0.6 10*3/uL (ref 0.1–1.0)
Monocytes Relative: 8 %
Neutro Abs: 5.2 10*3/uL (ref 1.7–7.7)
Neutrophils Relative %: 74 %
Platelets: 268 10*3/uL (ref 150–400)
RBC: 4.82 MIL/uL (ref 4.22–5.81)
RDW: 13.7 % (ref 11.5–15.5)
WBC: 7.1 10*3/uL (ref 4.0–10.5)
nRBC: 0 % (ref 0.0–0.2)

## 2024-05-31 LAB — CBG MONITORING, ED: Glucose-Capillary: 191 mg/dL — ABNORMAL HIGH (ref 70–99)

## 2024-05-31 NOTE — ED Provider Notes (Signed)
 Signed out that labs pending, to d/c to home when labs resulted.  Results for orders placed or performed during the hospital encounter of 05/31/24  CBG monitoring, ED   Collection Time: 05/31/24  1:08 PM  Result Value Ref Range   Glucose-Capillary 191 (H) 70 - 99 mg/dL  Comprehensive metabolic panel   Collection Time: 05/31/24  6:02 PM  Result Value Ref Range   Sodium 137 135 - 145 mmol/L   Potassium 3.6 3.5 - 5.1 mmol/L   Chloride 104 98 - 111 mmol/L   CO2 26 22 - 32 mmol/L   Glucose, Bld 161 (H) 70 - 99 mg/dL   BUN 13 8 - 23 mg/dL   Creatinine, Ser 1.66 0.61 - 1.24 mg/dL   Calcium 06.3 8.9 - 01.6 mg/dL   Total Protein 7.2 6.5 - 8.1 g/dL   Albumin 3.5 3.5 - 5.0 g/dL   AST 14 (L) 15 - 41 U/L   ALT 11 0 - 44 U/L   Alkaline Phosphatase 47 38 - 126 U/L   Total Bilirubin 0.6 0.0 - 1.2 mg/dL   GFR, Estimated >01 >09 mL/min   Anion gap 7 5 - 15  CBC with Differential   Collection Time: 05/31/24  6:02 PM  Result Value Ref Range   WBC 7.1 4.0 - 10.5 K/uL   RBC 4.82 4.22 - 5.81 MIL/uL   Hemoglobin 14.2 13.0 - 17.0 g/dL   HCT 32.3 55.7 - 32.2 %   MCV 89.6 80.0 - 100.0 fL   MCH 29.5 26.0 - 34.0 pg   MCHC 32.9 30.0 - 36.0 g/dL   RDW 02.5 42.7 - 06.2 %   Platelets 268 150 - 400 K/uL   nRBC 0.0 0.0 - 0.2 %   Neutrophils Relative % 74 %   Neutro Abs 5.2 1.7 - 7.7 K/uL   Lymphocytes Relative 18 %   Lymphs Abs 1.3 0.7 - 4.0 K/uL   Monocytes Relative 8 %   Monocytes Absolute 0.6 0.1 - 1.0 K/uL   Eosinophils Relative 0 %   Eosinophils Absolute 0.0 0.0 - 0.5 K/uL   Basophils Relative 0 %   Basophils Absolute 0.0 0.0 - 0.1 K/uL   Immature Granulocytes 0 %   Abs Immature Granulocytes 0.02 0.00 - 0.07 K/uL    Labs unremarkable. Pt without complaint or symptoms, other than saying he is hungry, has not eaten or drank all day.  Food and drink provided. Ambulates in hall. No faintness. Nsr on monitor. No pain, symptoms, faintness or other c/o.  Pt currently appears stable for ED d/c.    Rec pcp f/u.      Guadalupe Lee, MD 05/31/24 [phone]

## 2024-05-31 NOTE — Discharge Instructions (Addendum)
 It was our pleasure to provide your ER care today - we hope that you feel better.  Drink plenty of fluids/stay well hydrated.  If out in heat and begin to feel excessively hot or faint - make sure to get into cool area, drink fluids.   Follow up closely with your primary care doctor in the next 1-2 weeks - also have your blood pressure recheck then as it is high today.  Return to ER if worse, new symptoms, fevers, chest pain, trouble breathing, fainting, or other concern.

## 2024-05-31 NOTE — ED Provider Notes (Signed)
 [place] EMERGENCY DEPARTMENT AT [facility] Provider Note   CSN: [id number] Arrival date & time: 05/31/24  1244     Patient presents with: No chief complaint on file.   Xavier Moore is a 78 y.o. male.   Patient is a 78 year old male with past medical history of hypertension, diabetes and BPH presenting to the emergency department with syncope.  Patient states that he went to [employer] this morning to go grocery shopping.  He states that he called a cab to give him a ride home once he was done.  He states that he was waiting outside in the heat for the cab when he started to get very hot.  He states that he went inside the store and then woke up on the ground surrounded by a bunch of bystanders who said that he passed out.  He states that he fell back into the grocery carts but does not think that he hit his head.  He states he had no chest pain or shortness of breath prior to the fall.  He denies any pain or injuries from falling.  He states that he had not had anything to eat or drink today but otherwise had been feeling well like his normal self.  Denies any recent illnesses, vomiting, diarrhea.  The history is provided by the patient.       Prior to Admission medications   Medication Sig Start Date End Date Taking? Authorizing Provider  finasteride  (PROSCAR ) 5 MG tablet Take 1 tablet (5 mg total) by mouth daily. 12/20/16   Matilde Son A, PA-C  lisinopril (PRINIVIL,ZESTRIL) 20 MG tablet Take 20 mg by mouth daily.    [provider]  metFORMIN (GLUCOPHAGE) 1000 MG tablet Take 1 tablet by mouth daily with breakfast. 01/26/23   [provider]  omeprazole (PRILOSEC) 20 MG capsule Take 20 mg by mouth daily.    [provider]  sildenafil  (VIAGRA ) 100 MG tablet Take 1 tablet (100 mg total) by mouth daily as needed for erectile dysfunction. 09/09/16   Matilde Son A, PA-C  tamsulosin  (FLOMAX ) 0.4 MG CAPS capsule TAKE ONE CAPSULE BY MOUTH ONCE  DAILY 05/24/17   Dustin Gimenez, MD    Allergies: Patient has no known allergies.    Review of Systems  Updated Vital Signs BP (!) 124/97   Temp (!) 95 F (35 C) (Oral)   Resp 18   Ht 6' 1 (1.854 m)   Wt 81.6 kg   SpO2 96%   BMI 23.75 kg/m   Physical Exam Vitals and nursing note reviewed.  Constitutional:      General: He is not in acute distress.    Appearance: Normal appearance.  HENT:     Head: Normocephalic.     Nose: Nose normal.     Mouth/Throat:     Mouth: Mucous membranes are moist.     Pharynx: Oropharynx is clear.   Eyes:     Extraocular Movements: Extraocular movements intact.     Conjunctiva/sclera: Conjunctivae normal.    Cardiovascular:     Rate and Rhythm: Normal rate and regular rhythm.     Heart sounds: Normal heart sounds.  Pulmonary:     Effort: Pulmonary effort is normal.     Breath sounds: Normal breath sounds.  Abdominal:     General: Abdomen is flat.     Palpations: Abdomen is soft.     Tenderness: There is no abdominal tenderness.   Musculoskeletal:  General: Normal range of motion.     Cervical back: Normal range of motion.   Skin:    General: Skin is dry.     Comments: Mildly cool upper extremities, lower extremities warm   Neurological:     General: No focal deficit present.     Mental Status: He is alert and oriented to person, place, and time.   Psychiatric:        Mood and Affect: Mood normal.        Behavior: Behavior normal.     (all labs ordered are listed, but only abnormal results are displayed) Labs Reviewed  CBG MONITORING, ED - Abnormal; Notable for the following components:      Result Value   Glucose-Capillary 191 (*)    All other components within normal limits  COMPREHENSIVE METABOLIC PANEL WITH GFR  CBC WITH DIFFERENTIAL/PLATELET    EKG: EKG Interpretation Date/Time:  Thursday May 31 2024 13:51:15 EDT Ventricular Rate:  69 PR Interval:  151 QRS Duration:  93 QT Interval:  402 QTC  Calculation: 431 R Axis:   10  Text Interpretation: Sinus rhythm Left ventricular hypertrophy No significant change since last tracing Confirmed by Celesta Coke ([id number]) on 05/31/2024 1:54:47 PM  Radiology: No results found.   Procedures   Medications Ordered in the ED - No data to display  Clinical Course as of 05/31/24 1522  Thu May 31, 2024  1521 Patient signed out to Dr. Moses Arenas pending labs for disposition. [VK]    Clinical Course User Index [VK] Kingsley, Shankar Silber K, DO                                 Medical Decision Making This patient presents to the ED with chief complaint(s) of syncope with pertinent past medical history of hypertension, diabetes, BPH which further complicates the presenting complaint. The complaint involves an extensive differential diagnosis and also carries with it a high risk of complications and morbidity.    The differential diagnosis includes arrhythmia, anemia, dehydration, electrolyte abnormality, orthostatic hypotension, heat related syncope, hypo or hyperglycemia, no traumatic injury seen on exam  Additional history obtained: Additional history obtained from N/A Records reviewed Care Everywhere/External Records  ED Course and Reassessment: On patient's arrival he was hypothermic orally otherwise hemodynamically stable in no acute distress.  Glucose on arrival was normal.  He had EKG performed that showed normal sinus rhythm without acute ischemic changes.  Patient will have labs performed to evaluate for other etiology of his syncope and will be closely reassessed.  Independent labs interpretation:  -Pending  Independent visualization of imaging: -N/A    Amount and/or Complexity of Data Reviewed Labs: ordered.       Final diagnoses:  None    ED Discharge Orders     None          Nolberto Batty, [place] 05/31/24 1522

## 2024-05-31 NOTE — ED Triage Notes (Signed)
 PT BIB [facility] from [employer] after sitting outside [employer] for 30 minutes, went inside and had a witnessed syncopal episode. Did not fall per bystanders and PT. Aox4. Denies anything to eat or drink.   [facility] VS:  CBG 199 BP 140/90,  99% RA , HR 70

## 2024-05-31 NOTE — ED Notes (Signed)
 CCMD called.

## 2024-09-27 ENCOUNTER — Ambulatory Visit: Admitting: Podiatry

## 2024-09-27 DIAGNOSIS — B351 Tinea unguium: Secondary | ICD-10-CM | POA: Diagnosis not present

## 2024-09-27 DIAGNOSIS — M79674 Pain in right toe(s): Secondary | ICD-10-CM | POA: Diagnosis not present

## 2024-09-27 DIAGNOSIS — M79675 Pain in left toe(s): Secondary | ICD-10-CM | POA: Diagnosis not present

## 2024-09-27 NOTE — Progress Notes (Signed)
  Subjective:  Patient ID: Xavier Moore, male    DOB: [birth year],  MRN: [id number]  Chief Complaint  Patient presents with   Nail Problem    Nail trim    78 y.o. male returns for the above complaint.  Patient presents with thickened fungal dystrophic mycotic toenails x 10 mild pain on palpation worse with ambulation is with pressure patient would like for me to have a debride down unable to do much so myself denies any other acute issues  Objective:  There were no vitals filed for this visit. Podiatric Exam: Vascular: dorsalis pedis and posterior tibial pulses are palpable bilateral. Capillary return is immediate. Temperature gradient is WNL. Skin turgor WNL  Sensorium: Normal Semmes Weinstein monofilament test. Normal tactile sensation bilaterally. Nail Exam: Pt has thick disfigured discolored nails with subungual debris noted bilateral entire nail hallux through fifth toenails.  Pain on palpation to the nails. Ulcer Exam: There is no evidence of ulcer or pre-ulcerative changes or infection. Orthopedic Exam: Muscle tone and strength are WNL. No limitations in general ROM. No crepitus or effusions noted.  Skin: No Porokeratosis. No infection or ulcers    Assessment & Plan:   1. Pain due to onychomycosis of toenails of both feet     Patient was evaluated and treated and all questions answered.  Onychomycosis with pain  -Nails palliatively debrided as below. -Educated on self-care  Procedure: Nail Debridement Rationale: pain  Type of Debridement: manual, sharp debridement. Instrumentation: Nail nipper, rotary burr. Number of Nails: 10  Procedures and Treatment: Consent by patient was obtained for treatment procedures. The patient understood the discussion of treatment and procedures well. All questions were answered thoroughly reviewed. Debridement of mycotic and hypertrophic toenails, 1 through 5 bilateral and clearing of subungual debris. No ulceration, no infection  noted.  Return Visit-Office Procedure: Patient instructed to return to the office for a follow up visit 3 months for continued evaluation and treatment.  Xavier Moore, DPM    Return in about 3 months (around 12/28/2024) for RFC .

## 2024-10-28 ENCOUNTER — Inpatient Hospital Stay
Admission: EM | Admit: 2024-10-28 | Discharge: 2024-11-02 | DRG: 682 | Disposition: A | Discharge status: Skilled Nursing Facility | Attending: Student | Admitting: Student

## 2024-10-28 ENCOUNTER — Inpatient Hospital Stay

## 2024-10-28 ENCOUNTER — Emergency Department

## 2024-10-28 ENCOUNTER — Other Ambulatory Visit: Payer: Self-pay

## 2024-10-28 DIAGNOSIS — E440 Moderate protein-calorie malnutrition: Secondary | ICD-10-CM | POA: Diagnosis not present

## 2024-10-28 DIAGNOSIS — N401 Enlarged prostate with lower urinary tract symptoms: Secondary | ICD-10-CM | POA: Diagnosis present

## 2024-10-28 DIAGNOSIS — Z8419 Family history of other disorders of kidney and ureter: Secondary | ICD-10-CM

## 2024-10-28 DIAGNOSIS — E43 Unspecified severe protein-calorie malnutrition: Secondary | ICD-10-CM | POA: Diagnosis present

## 2024-10-28 DIAGNOSIS — E119 Type 2 diabetes mellitus without complications: Secondary | ICD-10-CM | POA: Diagnosis present

## 2024-10-28 DIAGNOSIS — Z7984 Long term (current) use of oral hypoglycemic drugs: Secondary | ICD-10-CM

## 2024-10-28 DIAGNOSIS — Z6820 Body mass index (BMI) 20.0-20.9, adult: Secondary | ICD-10-CM | POA: Diagnosis not present

## 2024-10-28 DIAGNOSIS — K219 Gastro-esophageal reflux disease without esophagitis: Secondary | ICD-10-CM | POA: Diagnosis present

## 2024-10-28 DIAGNOSIS — Z9181 History of falling: Secondary | ICD-10-CM

## 2024-10-28 DIAGNOSIS — Z87891 Personal history of nicotine dependence: Secondary | ICD-10-CM | POA: Diagnosis not present

## 2024-10-28 DIAGNOSIS — I10 Essential (primary) hypertension: Secondary | ICD-10-CM | POA: Diagnosis present

## 2024-10-28 DIAGNOSIS — E860 Dehydration: Secondary | ICD-10-CM | POA: Diagnosis present

## 2024-10-28 DIAGNOSIS — Z79899 Other long term (current) drug therapy: Secondary | ICD-10-CM

## 2024-10-28 DIAGNOSIS — E876 Hypokalemia: Secondary | ICD-10-CM | POA: Diagnosis present

## 2024-10-28 DIAGNOSIS — N179 Acute kidney failure, unspecified: Secondary | ICD-10-CM | POA: Diagnosis present

## 2024-10-28 DIAGNOSIS — N138 Other obstructive and reflux uropathy: Secondary | ICD-10-CM | POA: Diagnosis present

## 2024-10-28 DIAGNOSIS — R54 Age-related physical debility: Secondary | ICD-10-CM | POA: Diagnosis present

## 2024-10-28 DIAGNOSIS — R000 Tachycardia, unspecified: Secondary | ICD-10-CM | POA: Diagnosis present

## 2024-10-28 DIAGNOSIS — R531 Weakness: Secondary | ICD-10-CM

## 2024-10-28 DIAGNOSIS — R55 Syncope and collapse: Secondary | ICD-10-CM | POA: Diagnosis present

## 2024-10-28 HISTORY — DX: Type 2 diabetes mellitus without complications: E11.9

## 2024-10-28 LAB — COMPREHENSIVE METABOLIC PANEL WITH GFR
ALT: 12 U/L (ref 0–44)
AST: 16 U/L (ref 15–41)
Albumin: 4.2 g/dL (ref 3.5–5.0)
Alkaline Phosphatase: 62 U/L (ref 38–126)
Anion gap: 19 — ABNORMAL HIGH (ref 5–15)
BUN: 39 mg/dL — ABNORMAL HIGH (ref 8–23)
CO2: 24 mmol/L (ref 22–32)
Calcium: 9.5 mg/dL (ref 8.9–10.3)
Chloride: 94 mmol/L — ABNORMAL LOW (ref 98–111)
Creatinine, Ser: 1.56 mg/dL — ABNORMAL HIGH (ref 0.61–1.24)
GFR, Estimated: 45 mL/min — ABNORMAL LOW (ref >60)
Glucose, Bld: 182 mg/dL — ABNORMAL HIGH (ref 70–99)
Potassium: 3 mmol/L — ABNORMAL LOW (ref 3.5–5.1)
Sodium: 138 mmol/L (ref 135–145)
Total Bilirubin: 1.5 mg/dL — ABNORMAL HIGH (ref 0.0–1.2)
Total Protein: 7.4 g/dL (ref 6.5–8.1)

## 2024-10-28 LAB — PHOSPHORUS: Phosphorus: 2.6 mg/dL (ref 2.5–4.6)

## 2024-10-28 LAB — CBG MONITORING, ED: Glucose-Capillary: 176 mg/dL — ABNORMAL HIGH (ref 70–99)

## 2024-10-28 LAB — GLUCOSE, CAPILLARY
Glucose-Capillary: 103 mg/dL — ABNORMAL HIGH (ref 70–99)
Glucose-Capillary: 190 mg/dL — ABNORMAL HIGH (ref 70–99)

## 2024-10-28 LAB — RESP PANEL BY RT-PCR (RSV, FLU A&B, COVID)  RVPGX2
Influenza A by PCR: NEGATIVE
Influenza B by PCR: NEGATIVE
Resp Syncytial Virus by PCR: NEGATIVE
SARS Coronavirus 2 by RT PCR: NEGATIVE

## 2024-10-28 LAB — URINALYSIS, ROUTINE W REFLEX MICROSCOPIC
Bacteria, UA: NONE SEEN
Bilirubin Urine: NEGATIVE
Glucose, UA: 50 mg/dL — AB
Hgb urine dipstick: NEGATIVE
Ketones, ur: 5 mg/dL — AB
Leukocytes,Ua: NEGATIVE
Nitrite: NEGATIVE
Protein, ur: 100 mg/dL — AB
Specific Gravity, Urine: 1.02 (ref 1.005–1.030)
pH: 5 (ref 5.0–8.0)

## 2024-10-28 LAB — CBC
HCT: 49.2 % (ref 39.0–52.0)
Hemoglobin: 17.2 g/dL — ABNORMAL HIGH (ref 13.0–17.0)
MCH: 31.7 pg (ref 26.0–34.0)
MCHC: 35 g/dL (ref 30.0–36.0)
MCV: 90.8 fL (ref 80.0–100.0)
Platelets: 235 K/uL (ref 150–400)
RBC: 5.42 MIL/uL (ref 4.22–5.81)
RDW: 13 % (ref 11.5–15.5)
WBC: 8.5 K/uL (ref 4.0–10.5)
nRBC: 0 % (ref 0.0–0.2)

## 2024-10-28 LAB — MAGNESIUM: Magnesium: 0.7 mg/dL — CL (ref 1.7–2.4)

## 2024-10-28 LAB — POTASSIUM: Potassium: 2.9 mmol/L — ABNORMAL LOW (ref 3.5–5.1)

## 2024-10-28 MED ORDER — INSULIN ASPART 100 UNIT/ML IJ SOLN
0.0000 [IU] | Freq: Three times a day (TID) | INTRAMUSCULAR | Status: DC
  Administered 2024-10-29 – 2024-10-30 (×3): 1 [IU] via SUBCUTANEOUS
  Administered 2024-10-30: 2 [IU] via SUBCUTANEOUS
  Administered 2024-10-31 (×2): 1 [IU] via SUBCUTANEOUS
  Administered 2024-10-31 – 2024-11-01 (×2): 3 [IU] via SUBCUTANEOUS
  Administered 2024-11-01: 5 [IU] via SUBCUTANEOUS
  Administered 2024-11-02: 2 [IU] via SUBCUTANEOUS
  Administered 2024-11-02: 1 [IU] via SUBCUTANEOUS
  Administered 2024-11-02: 3 [IU] via SUBCUTANEOUS
  Filled 2024-10-28: qty 3
  Filled 2024-10-28 (×2): qty 2
  Filled 2024-10-28: qty 3
  Filled 2024-10-28 (×4): qty 1
  Filled 2024-10-28: qty 5
  Filled 2024-10-28: qty 1
  Filled 2024-10-28: qty 3
  Filled 2024-10-28: qty 1
  Filled 2024-10-28: qty 3

## 2024-10-28 MED ORDER — ACETAMINOPHEN 325 MG PO TABS: 650.0000 mg | ORAL_TABLET | Freq: Four times a day (QID) | ORAL | Status: AC | PRN

## 2024-10-28 MED ORDER — POTASSIUM CHLORIDE CRYS ER 20 MEQ PO TBCR
20.0000 meq | EXTENDED_RELEASE_TABLET | Freq: Once | ORAL | Status: AC
  Administered 2024-10-28: 20 meq via ORAL
  Filled 2024-10-28: qty 1

## 2024-10-28 MED ORDER — PANTOPRAZOLE SODIUM 40 MG PO TBEC
40.0000 mg | DELAYED_RELEASE_TABLET | Freq: Every day | ORAL | Status: DC
  Administered 2024-10-29 – 2024-11-02 (×5): 40 mg via ORAL
  Filled 2024-10-28 (×5): qty 1

## 2024-10-28 MED ORDER — INSULIN ASPART 100 UNIT/ML IJ SOLN
0.0000 [IU] | Freq: Every day | INTRAMUSCULAR | Status: DC
  Administered 2024-10-31 – 2024-11-01 (×2): 2 [IU] via SUBCUTANEOUS
  Filled 2024-10-28 (×2): qty 2

## 2024-10-28 MED ORDER — POTASSIUM CHLORIDE 10 MEQ/100ML IV SOLN
10.0000 meq | INTRAVENOUS | Status: AC
  Administered 2024-10-28 (×3): 10 meq via INTRAVENOUS
  Filled 2024-10-28 (×3): qty 100

## 2024-10-28 MED ORDER — POTASSIUM CHLORIDE 10 MEQ/100ML IV SOLN
10.0000 meq | Freq: Once | INTRAVENOUS | Status: AC
  Administered 2024-10-28: 10 meq via INTRAVENOUS
  Filled 2024-10-28: qty 100

## 2024-10-28 MED ORDER — ENOXAPARIN SODIUM 40 MG/0.4ML IJ SOSY
40.0000 mg | PREFILLED_SYRINGE | INTRAMUSCULAR | Status: DC
  Administered 2024-10-28 – 2024-11-01 (×5): 40 mg via SUBCUTANEOUS
  Filled 2024-10-28 (×5): qty 0.4

## 2024-10-28 MED ORDER — MAGNESIUM SULFATE 2 GM/50ML IV SOLN
2.0000 g | Freq: Every day | INTRAVENOUS | Status: AC
  Administered 2024-10-28: 2 g via INTRAVENOUS
  Filled 2024-10-28: qty 50

## 2024-10-28 MED ORDER — FINASTERIDE 5 MG PO TABS
5.0000 mg | ORAL_TABLET | Freq: Every day | ORAL | Status: DC
  Administered 2024-10-29 – 2024-11-02 (×5): 5 mg via ORAL
  Filled 2024-10-28 (×5): qty 1

## 2024-10-28 MED ORDER — MAGNESIUM SULFATE 2 GM/50ML IV SOLN
2.0000 g | Freq: Once | INTRAVENOUS | Status: AC
  Administered 2024-10-28: 2 g via INTRAVENOUS
  Filled 2024-10-28: qty 50

## 2024-10-28 MED ORDER — ONDANSETRON HCL 4 MG PO TABS
4.0000 mg | ORAL_TABLET | Freq: Four times a day (QID) | ORAL | Status: AC | PRN
Start: 2024-10-28 — End: 2024-11-02

## 2024-10-28 MED ORDER — SODIUM CHLORIDE 0.9 % IV BOLUS
1000.0000 mL | Freq: Once | INTRAVENOUS | Status: AC
  Administered 2024-10-28: 1000 mL via INTRAVENOUS

## 2024-10-28 MED ORDER — HYDRALAZINE HCL 20 MG/ML IJ SOLN
5.0000 mg | Freq: Four times a day (QID) | INTRAMUSCULAR | Status: AC | PRN
  Administered 2024-10-30: 5 mg via INTRAVENOUS
  Filled 2024-10-28 (×2): qty 1

## 2024-10-28 MED ORDER — ONDANSETRON HCL 4 MG/2ML IJ SOLN: 4.0000 mg | Freq: Four times a day (QID) | INTRAMUSCULAR | Status: AC | PRN

## 2024-10-28 MED ORDER — LACTATED RINGERS IV SOLN: INTRAVENOUS | Status: DC

## 2024-10-28 MED ORDER — ACETAMINOPHEN 650 MG RE SUPP: 650.0000 mg | Freq: Four times a day (QID) | RECTAL | Status: AC | PRN

## 2024-10-28 MED ORDER — SENNOSIDES-DOCUSATE SODIUM 8.6-50 MG PO TABS: 1.0000 | ORAL_TABLET | Freq: Every evening | ORAL | Status: DC | PRN

## 2024-10-28 MED ORDER — TAMSULOSIN HCL 0.4 MG PO CAPS
0.4000 mg | ORAL_CAPSULE | Freq: Every day | ORAL | Status: DC
  Administered 2024-10-28 – 2024-11-02 (×6): 0.4 mg via ORAL
  Filled 2024-10-28 (×6): qty 1

## 2024-10-28 MED ORDER — POTASSIUM CHLORIDE CRYS ER 20 MEQ PO TBCR
40.0000 meq | EXTENDED_RELEASE_TABLET | Freq: Once | ORAL | Status: AC
  Administered 2024-10-28: 40 meq via ORAL
  Filled 2024-10-28: qty 2

## 2024-10-28 NOTE — Assessment & Plan Note (Signed)
 Versus severe Check magnesium, phosphorus level Replace as appropriate

## 2024-10-28 NOTE — Assessment & Plan Note (Signed)
 Patient takes finasteride  5 mg daily, tamsulosin  0.4 mg capsule

## 2024-10-28 NOTE — ED Notes (Signed)
 Informed RN bed assigned

## 2024-10-28 NOTE — Assessment & Plan Note (Signed)
 Replace with magnesium 2 g IV twice daily, 2 doses ordered on day of admission Recheck magnesium level in the a.m.

## 2024-10-28 NOTE — Assessment & Plan Note (Addendum)
 Patient not on home scheduled antihypertensive medication Hydralazine 5 mg IV every 6 hours as needed for SBP > 170, 5 days ordered

## 2024-10-28 NOTE — Assessment & Plan Note (Addendum)
 Suspect secondary to poor p.o. intake leading to acute kidney injury and hypokalemia Fall precaution, aspiration precaution PT, OT -recommending SNF

## 2024-10-28 NOTE — ED Triage Notes (Signed)
 Pt to ED with family member POV for several falls this week and low appetite last 3-4 days. States when he falls he feels weak and dizzy before falling. Last fall 3 days ago. Has hit head. Denies LOC. Denies CP, SOB. No thinners. Is alert, oriented. Uses walker.

## 2024-10-28 NOTE — Assessment & Plan Note (Addendum)
 Potassium level improved to 3.4 with repletion. -Replete more potassium Continue to monitor-

## 2024-10-28 NOTE — Hospital Course (Signed)
 [name] is a 78 year old male with history of hypertension, non-insulin-dependent diabetes mellitus, BPH, GERD, who presents ED for chief concerns of weakness, increasing fall and low appetite over the last 3 to 4 days.  Vitals in the ED showed t of 98, rr 20, hr 97, blood pressure 142/97, SpO2 99% on room air. 5 7 sodium is 138, potassium 3.0, magnesium 0.7 chloride 94, bicarb 24, BUN of 39, serum creatinine 1.56, eGFR 45, nonfasting blood glucose 152, WBC 8.5, hemoglobin 17.2, platelet 235.  UA is negative for leukocytes, negative for nitrates.  COVID/influenza A/influenza B/RSV PCR were negative.  Electrolytes were ordered along with IV fluid.  11/17: Hemodynamically stable, labs with magnesium improved to 1.8, up potassium 3.4, creatinine improved to 1.11.  Giving more potassium and magnesium.  Will continue IV fluid for another day.  PT is recommending SNF.  Patient was without food for the past few days, becoming progressively weaker and called the EMS.  11/18: Vital stable, labs with hypophosphatemia at 1.4, hypokalemia 3.3 and borderline magnesium at 1.7-electrolytes are being repleted. TOC working on placement.

## 2024-10-28 NOTE — H&P (Addendum)
 History and Physical   Xavier Moore:[id number] DOB: 10/30/1946 DOA: 10/28/2024  PCP: [facility] [address]  Patient coming from: Home  I have personally briefly reviewed patient's old medical records in [facility] EMR.  Chief Concern: Weakness  HPI: [name] is a 78 year old male with history of hypertension, non-insulin-dependent diabetes mellitus, BPH, GERD, who presents ED for chief concerns of weakness, increasing fall and low appetite over the last 3 to 4 days.  Vitals in the ED showed t of 98, rr 20, hr 97, blood pressure 142/97, SpO2 99% on room air. 5 7 sodium is 138, potassium 3.0, chloride 94, bicarb 24, BUN of 39, serum creatinine 1.56, eGFR 45, nonfasting blood glucose 152, WBC 8.5, hemoglobin 17.2, platelet 235.  UA is negative for leukocytes, negative for nitrates.  COVID/influenza A/influenza B/RSV PCR were negative.  ED treatment: Potassium chloride 20 mEq p.o. one-time dose, sodium chloride 1 L bolus, potassium chloride 10 mEq IV one-time dose. ----------------------------------- At bedside, patient is able to tell me his first last name, age, location, current calendar year.  He reports he has been feeling weak with low appetite for about 1 week.  He reports no chest pain, shortness of breath, abdominal pain, dysuria, hematuria, diarrhea, blood in stool, blood in his urine.  He denies syncope.  He states there is no explanation other than he just had no appetite and now feels weak.  Social history: He lives at home.  He denies tobacco, EtOH, recreational drug use.  He is retired.  ROS: Constitutional: no weight change, no fever ENT/Mouth: no sore throat, no rhinorrhea Eyes: no eye pain, no vision changes Cardiovascular: no chest pain, no dyspnea,  no edema, no palpitations Respiratory: no cough, no sputum, no wheezing Gastrointestinal: no nausea, no vomiting, no diarrhea, no constipation Genitourinary: no urinary incontinence, no dysuria, no  hematuria Musculoskeletal: no arthralgias, no myalgias Skin: no skin lesions, no pruritus, Neuro: + weakness, no loss of consciousness, no syncope Psych: no anxiety, no depression, + decrease appetite Heme/Lymph: no bruising, no bleeding  ED Course: Discussed with EDP, patient requiring hospitalization for chief concerns of AKI and hypokalemia.  Assessment/Plan  Principal Problem:   Hypokalemia Active Problems:   AKI (acute kidney injury)   Hypomagnesemia   Diabetes mellitus type 2, noninsulin dependent (HCC)   Benign prostatic hyperplasia with urinary obstruction   Essential hypertension   GERD (gastroesophageal reflux disease)   Weakness   Protein-calorie malnutrition, moderate   Assessment and Plan:  * Hypokalemia Check serum magnesium level, if low will replace Status post potassium chloride 30 mEq total IV and p.o. per EDP Will repeat potassium level at 1430 hrs. and replace more if needed  Addendum: Potassium level was 2.9 which is a decrease from ED labs Will replace more with potassium chloride IV 10 mEq, 3 doses and 40 mEq p.o. one-time dose.  Hypomagnesemia Replace with magnesium 2 g IV twice daily, 2 doses ordered on day of admission Recheck magnesium level in the a.m.  AKI (acute kidney injury) No prior CKD Status post sodium chloride 1 L bolus per EDP LR infusion at 125 mL/h, 1 day ordered Recheck BMP in the a.m.  Diabetes mellitus type 2, noninsulin dependent (HCC) Home metformin 1000 mg daily will not be resumed on admission Insulin SSI with at bedtime coverage, renal dosing ordered on admission Goal inpatient blood glucose levels 140-180  GERD (gastroesophageal reflux disease) Home PPI  Essential hypertension Patient not on home scheduled antihypertensive medication Hydralazine 5  mg IV every 6 hours as needed for SBP > 170, 5 days ordered  Benign prostatic hyperplasia with urinary obstruction Patient takes finasteride  5 mg daily, tamsulosin  0.4  mg capsule  Protein-calorie malnutrition, moderate Versus severe Check magnesium, phosphorus level Replace as appropriate  Weakness Suspect secondary to poor p.o. intake leading to acute kidney injury and hypokalemia Fall precaution, aspiration precaution PT, OT for tomorrow  Chart reviewed.   DVT prophylaxis: Enoxaparin Code Status: Full code Diet: Renal/carb modified Family Communication: No Disposition Plan: Clinical course Consults called: None at this time Admission status: Telemetry, inpatient  Past Medical History:  Diagnosis Date   Benign prostatic hypertrophy with lower urinary tract symptoms (LUTS)    Diabetes mellitus without complication (HCC)    type 2   Elevated PSA    GERD (gastroesophageal reflux disease)    Hypertension    Urinary retention    Past Surgical History:  Procedure Laterality Date   HAMMER TOE SURGERY  2010   Social History:  reports that he has quit smoking. He has never used smokeless tobacco. He reports current alcohol use. He reports current drug use. Drug: Marijuana.  No Known Allergies Family History  Problem Relation Age of Onset   Kidney disease Brother        2 on Diaysis   Cancer Neg Hx        Kidney,Prostate, Bladder   Family history: Family history reviewed and not pertinent.  Prior to Admission medications   Medication Sig Start Date End Date Taking? Authorizing Provider  finasteride  (PROSCAR ) 5 MG tablet Take 1 tablet (5 mg total) by mouth daily. 12/20/16   Helon Kirsch A, PA-C  lisinopril (PRINIVIL,ZESTRIL) 20 MG tablet Take 20 mg by mouth daily.    [provider]  metFORMIN (GLUCOPHAGE) 1000 MG tablet Take 1 tablet by mouth daily with breakfast. 01/26/23   [provider]  omeprazole (PRILOSEC) 20 MG capsule Take 20 mg by mouth daily.    [provider]  sildenafil  (VIAGRA ) 100 MG tablet Take 1 tablet (100 mg total) by mouth daily as needed for erectile dysfunction. 09/09/16   Helon Kirsch A, PA-C  tamsulosin  (FLOMAX ) 0.4 MG CAPS capsule TAKE ONE CAPSULE BY MOUTH ONCE DAILY 05/24/17   Penne Knee, MD   Physical Exam: Vitals:   10/28/24 1330 10/28/24 1400 10/28/24 1430 10/28/24 1458  BP: (!) 142/97 (!) 138/91 136/87 (!) 152/70  Pulse: 97 (!) 101 96 88  Resp:    18  Temp:    97.9 F (36.6 C)  TempSrc:    Oral  SpO2:    99%  Weight:      Height:       Constitutional: appears age-appropriate, weak, NAD, calm Eyes: PERRL, lids and conjunctivae normal ENMT: Mucous membranes are moist. Posterior pharynx clear of any exudate or lesions. Age-appropriate dentition. Hearing appropriate Neck: normal, supple, no masses, no thyromegaly Respiratory: clear to auscultation bilaterally, no wheezing, no crackles. Normal respiratory effort. No accessory muscle use.  Cardiovascular: Regular rate and rhythm, no murmurs / rubs / gallops. No extremity edema. 2+ pedal pulses. No carotid bruits.  Abdomen: no tenderness, no masses palpated, no hepatosplenomegaly. Bowel sounds positive.  Musculoskeletal: no clubbing / cyanosis. No joint deformity upper and lower extremities. Good ROM, no contractures, no atrophy. Normal muscle tone.  Skin: no rashes, lesions, ulcers. No induration Neurologic: Sensation intact. Strength 5/5 in all 4.  Psychiatric: Normal judgment and insight. Alert and oriented x 3. Normal mood.  EKG: independently reviewed, showing sinus tachycardia with rate of 113, QTc 460  Chest x-ray on Admission: I personally reviewed and I agree with radiologist reading as below.  DG Chest Port 1 View Result Date: 10/28/2024 CLINICAL DATA:  Weakness and shortness of breath. EXAM: PORTABLE CHEST 1 VIEW COMPARISON:  10/30/2014. FINDINGS: The heart size and mediastinal contours are within normal limits. There is hilar fullness bilaterally. Lung volumes are low with mild atelectasis at the lung bases. No effusion or pneumothorax is seen. Old healed rib fractures are present on the  right. No acute osseous abnormality. IMPRESSION: 1. Mild atelectasis at the lung bases. 2. Hilar fullness bilaterally, which may be due to enlarged lymph nodes, vasculature or overlapping densities. Short-term follow-up chest radiograph is recommended. Electronically Signed   By: Leita Birmingham M.D.   On: 10/28/2024 14:28   CT Cervical Spine Wo Contrast Result Date: 10/28/2024 CLINICAL DATA:  Neck trauma (Age >= 65y) Several falls this week with decreased appetite. Weakness and dizziness. EXAM: CT CERVICAL SPINE WITHOUT CONTRAST TECHNIQUE: Multidetector CT imaging of the cervical spine was performed without intravenous contrast. Multiplanar CT image reconstructions were also generated. RADIATION DOSE REDUCTION: This exam was performed according to the departmental dose-optimization program which includes automated exposure control, adjustment of the mA and/or kV according to patient size and/or use of iterative reconstruction technique. COMPARISON:  None Available. FINDINGS: Alignment: Straightening without focal angulation or significant listhesis. Skull base and vertebrae: No evidence of acute cervical spine fracture or traumatic subluxation. Soft tissues and spinal canal: No prevertebral fluid or swelling. No visible canal hematoma. Disc levels: Multilevel spondylosis with disc space narrowing and uncinate spurring most advanced from C3-4 through C6-7. Up to moderate spinal stenosis at C6-7. Multilevel mild to moderate osseous foraminal narrowing. No large disc herniation identified. Upper chest: Biapical scarring. There is a 9 mm irregular nodule at right apex on image 78/2, favored to reflect scarring. There is a right thyroid nodule which is incompletely visualized, measuring up to 2.3 cm on image 80/3. Other: Bilateral carotid atherosclerosis. IMPRESSION: 1. No evidence of acute cervical spine fracture, traumatic subluxation or static signs of instability. 2. Multilevel cervical spondylosis with up to  moderate spinal stenosis at C6-7. 3. 9 mm irregular nodule at the right lung apex, favored to reflect scarring. Consider follow-up chest CT in 6 months to assess stability. 4. 2.3 cm incidental right thyroid nodule. Recommend further evaluation with thyroid ultrasound unless contraindicated by the patient's comorbidities or limited life expectancy.(Ref: J Am Coll Radiol. 2015 Feb;12(2): 143-50). Electronically Signed   By: Elsie Perone M.D.   On: 10/28/2024 11:12   CT HEAD WO CONTRAST Result Date: 10/28/2024 EXAM: CT HEAD WITHOUT CONTRAST 10/28/2024 10:52:42 AM TECHNIQUE: CT of the head was performed without the administration of intravenous contrast. Automated exposure control, iterative reconstruction, and/or weight based adjustment of the mA/kV was utilized to reduce the radiation dose to as low as reasonably achievable. COMPARISON: None available. CLINICAL HISTORY: Head trauma, moderate-severe. FINDINGS: BRAIN AND VENTRICLES: Moderate atrophy and white matter changes are noted bilaterally. No acute hemorrhage. No evidence of acute infarct. No hydrocephalus. No extra-axial collection. No mass effect or midline shift. ORBITS: No acute abnormality. SINUSES: No acute abnormality. SOFT TISSUES AND SKULL: No acute soft tissue abnormality. No skull fracture. IMPRESSION: 1. No acute intracranial abnormality. 2. Moderate cerebral atrophy and bilateral white matter changes, chronic-appearing. Electronically signed by: Lonni Necessary MD 10/28/2024 11:11 AM EST RP Workstation: [id number]   Labs on Admission: I have  personally reviewed following labs  CBC: Recent Labs  Lab 10/28/24 1032  WBC 8.5  HGB 17.2*  HCT 49.2  MCV 90.8  PLT 235   Basic Metabolic Panel: Recent Labs  Lab 10/28/24 1032 10/28/24 1458  NA 138  --   K 3.0* 2.9*  CL 94*  --   CO2 24  --   GLUCOSE 182*  --   BUN 39*  --   CREATININE 1.56*  --   CALCIUM 9.5  --   MG  --  0.7*   GFR: Estimated Creatinine Clearance: 42.6  mL/min (A) (by C-G formula based on SCr of 1.56 mg/dL (H)).  Liver Function Tests: Recent Labs  Lab 10/28/24 1032  AST 16  ALT 12  ALKPHOS 62  BILITOT 1.5*  PROT 7.4  ALBUMIN 4.2   CBG: Recent Labs  Lab 10/28/24 1033  GLUCAP 176*   Urine analysis:    Component Value Date/Time   COLORURINE AMBER (A) 10/28/2024 1317   APPEARANCEUR HAZY (A) 10/28/2024 1317   APPEARANCEUR Clear 12/13/2014 0854   LABSPEC 1.020 10/28/2024 1317   LABSPEC 1.012 12/13/2014 0854   PHURINE 5.0 10/28/2024 1317   GLUCOSEU 50 (A) 10/28/2024 1317   GLUCOSEU 50 mg/dL 98/98/7983 9145   HGBUR NEGATIVE 10/28/2024 1317   BILIRUBINUR NEGATIVE 10/28/2024 1317   BILIRUBINUR negative 02/01/2023 0000   BILIRUBINUR Negative 12/13/2014 0854   KETONESUR 5 (A) 10/28/2024 1317   PROTEINUR 100 (A) 10/28/2024 1317   UROBILINOGEN 0.2 02/01/2023 0000   NITRITE NEGATIVE 10/28/2024 1317   LEUKOCYTESUR NEGATIVE 10/28/2024 1317   LEUKOCYTESUR Negative 12/13/2014 0854   CRITICAL CARE Performed by: Dr. Sherre  Total critical care time: 32 minutes  Critical care time was exclusive of separately billable procedures and treating other patients.  Critical care was necessary to treat or prevent imminent or life-threatening deterioration.  Critical care was time spent personally by me on the following activities: development of treatment plan with patient as well as nursing, discussions with consultants, evaluation of patient's response to treatment, examination of patient, obtaining history from patient or surrogate, ordering and performing treatments and interventions, ordering and review of laboratory studies, ordering and review of radiographic studies, pulse oximetry and re-evaluation of patient's condition.  This document was prepared using Dragon Voice Recognition software and may include unintentional dictation errors.  Dr. Sherre [facility]  If 7PM-7AM, please contact overnight-coverage provider If 7AM-7PM,  please contact day attending provider [email or web address]  10/28/2024, 4:05 PM

## 2024-10-28 NOTE — Assessment & Plan Note (Signed)
 Home metformin 1000 mg daily will not be resumed on admission Insulin SSI with at bedtime coverage, renal dosing ordered on admission Goal inpatient blood glucose levels 140-180

## 2024-10-28 NOTE — ED Provider Notes (Signed)
 [facility] Provider Note    Event Date/Time   First MD Initiated Contact with Patient 10/28/24 [phone]     (approximate)   History   Fall and Loss of Consciousness   HPI  Jovahn Breit is a 78 y.o. male generalized weakness.  Over the last week poor p.o. intake, having episodes of lightheadedness and falls most recent fall about 3 days ago.  He lives by himself at home, he states he does not have any interest in food, he has not had any sick contact has not noticed any fevers chills chest pain shortness of breath abdominal pain or other symptoms just overall feeling weak and unable to care for himself well.  Brought in by his cousin who also provides similar history.  Patient without any other complaints at this time.     Physical Exam   Triage Vital Signs: ED Triage Vitals  Encounter Vitals Group     BP 10/28/24 1026 115/82     Girls Systolic BP Percentile --      Girls Diastolic BP Percentile --      Boys Systolic BP Percentile --      Boys Diastolic BP Percentile --      Pulse Rate 10/28/24 1026 (!) 112     Resp 10/28/24 1026 20     Temp 10/28/24 1026 97.8 F (36.6 C)     Temp Source 10/28/24 1026 Oral     SpO2 10/28/24 1026 98 %     Weight 10/28/24 1030 170 lb (77.1 kg)     Height 10/28/24 1030 6' 1 (1.854 m)     Head Circumference --      Peak Flow --      Pain Score 10/28/24 1027 0     Pain Loc --      Pain Education --      Exclude from Growth Chart --     Most recent vital signs: Vitals:   10/28/24 1430 10/28/24 1458  BP: 136/87 (!) 152/70  Pulse: 96 88  Resp:  18  Temp:  97.9 F (36.6 C)  SpO2:  99%     General: Awake, no distress.  CV:  Good peripheral perfusion.  Tachycardic heart rate Resp:  Normal effort.  Clear to auscultation, able to speak in full sentences Abd:  No distention.  Soft nontender Neuro:  Answering questions appropriately, oriented and moving all extremities to command Other:     ED Results /  Procedures / Treatments   Labs (all labs ordered are listed, but only abnormal results are displayed) Labs Reviewed  COMPREHENSIVE METABOLIC PANEL WITH GFR - Abnormal; Notable for the following components:      Result Value   Potassium 3.0 (*)    Chloride 94 (*)    Glucose, Bld 182 (*)    BUN 39 (*)    Creatinine, Ser 1.56 (*)    Total Bilirubin 1.5 (*)    GFR, Estimated 45 (*)    Anion gap 19 (*)    All other components within normal limits  CBC - Abnormal; Notable for the following components:   Hemoglobin 17.2 (*)    All other components within normal limits  URINALYSIS, ROUTINE W REFLEX MICROSCOPIC - Abnormal; Notable for the following components:   Color, Urine AMBER (*)    APPearance HAZY (*)    Glucose, UA 50 (*)    Ketones, ur 5 (*)    Protein, ur 100 (*)    All  other components within normal limits  CBG MONITORING, ED - Abnormal; Notable for the following components:   Glucose-Capillary 176 (*)    All other components within normal limits  RESP PANEL BY RT-PCR (RSV, FLU A&B, COVID)  RVPGX2  MAGNESIUM  POTASSIUM     EKG  Sinus rhythm with rate of about 110, axis of 50, intervals appear to be within normal limits, I do not appreciate any obvious ischemia, unfortunately appears that there is some artifact on this EKG making it difficult to appropriately evaluate ST segment   RADIOLOGY   PROCEDURES:  Critical Care performed: No  Procedures   MEDICATIONS ORDERED IN ED: Medications  acetaminophen (TYLENOL) tablet 650 mg (has no administration in time range)    Or  acetaminophen (TYLENOL) suppository 650 mg (has no administration in time range)  ondansetron (ZOFRAN) tablet 4 mg (has no administration in time range)    Or  ondansetron (ZOFRAN) injection 4 mg (has no administration in time range)  enoxaparin (LOVENOX) injection 40 mg (has no administration in time range)  senna-docusate (Senokot-S) tablet 1 tablet (has no administration in time range)   insulin aspart (novoLOG) injection 0-5 Units (has no administration in time range)  insulin aspart (novoLOG) injection 0-9 Units (has no administration in time range)  pantoprazole (PROTONIX) EC tablet 40 mg (has no administration in time range)  finasteride  (PROSCAR ) tablet 5 mg (has no administration in time range)  tamsulosin  (FLOMAX ) capsule 0.4 mg (has no administration in time range)  hydrALAZINE (APRESOLINE) injection 5 mg (has no administration in time range)  sodium chloride 0.9 % bolus 1,000 mL (0 mLs Intravenous Stopped 10/28/24 1320)  potassium chloride 10 mEq in 100 mL IVPB (0 mEq Intravenous Stopped 10/28/24 1308)  potassium chloride SA (KLOR-CON M) CR tablet 20 mEq (20 mEq Oral Given 10/28/24 1206)     IMPRESSION / MDM / ASSESSMENT AND PLAN / ED COURSE  I reviewed the triage vital signs and the nursing notes.                               Patient's presentation is most consistent with acute, uncomplicated illness.  78 year old male who presents today with concern of generalized weakness.  He is tachycardic here, he developed a slight AKI, and he has an elevated anion gap which I suspect is likely secondary to ketosis given his poor p.o. intake over the last few days.  He does not have any focal findings here at this time we are awaiting urinalysis to further assess for other possible cause of weakness and also obtaining a COVID test.  Will follow-up results, provide fluid hydration, anticipate likely will warrant admission for failure to thrive at home, AKI, and possible rehab placement.   Clinical Course as of 10/28/24 1547  Sun Oct 28, 2024  1132 CT imaging here is without acute findings, he does have some hypokalemia which we will go ahead and address, awaiting urinalysis with anticipated admission. [SK]  1350 Spoke with Dr. Sherre who has agreed to evaluate the patient to determine course of further medical management. [SK]    Clinical Course User Index [SK] Fernand Rossie HERO, MD     FINAL CLINICAL IMPRESSION(S) / ED DIAGNOSES   Final diagnoses:  AKI (acute kidney injury)  Syncope, unspecified syncope type     Rx / DC Orders   ED Discharge Orders     None  Note:  This document was prepared using Dragon voice recognition software and may include unintentional dictation errors.   Fernand Rossie HERO, MD 10/28/24 [phone]

## 2024-10-28 NOTE — Assessment & Plan Note (Signed)
 Home PPI

## 2024-10-28 NOTE — Assessment & Plan Note (Addendum)
 No prior CKD Status post sodium chloride 1 L bolus per EDP LR infusion at 125 mL/h, 1 day ordered Recheck BMP in the a.m.

## 2024-10-29 DIAGNOSIS — R531 Weakness: Secondary | ICD-10-CM | POA: Diagnosis not present

## 2024-10-29 DIAGNOSIS — N179 Acute kidney failure, unspecified: Secondary | ICD-10-CM | POA: Diagnosis not present

## 2024-10-29 DIAGNOSIS — E440 Moderate protein-calorie malnutrition: Secondary | ICD-10-CM

## 2024-10-29 DIAGNOSIS — N401 Enlarged prostate with lower urinary tract symptoms: Secondary | ICD-10-CM

## 2024-10-29 DIAGNOSIS — E119 Type 2 diabetes mellitus without complications: Secondary | ICD-10-CM

## 2024-10-29 DIAGNOSIS — I10 Essential (primary) hypertension: Secondary | ICD-10-CM

## 2024-10-29 DIAGNOSIS — E876 Hypokalemia: Secondary | ICD-10-CM | POA: Diagnosis not present

## 2024-10-29 DIAGNOSIS — N138 Other obstructive and reflux uropathy: Secondary | ICD-10-CM

## 2024-10-29 DIAGNOSIS — K219 Gastro-esophageal reflux disease without esophagitis: Secondary | ICD-10-CM

## 2024-10-29 LAB — BASIC METABOLIC PANEL WITH GFR
Anion gap: 9 (ref 5–15)
BUN: 29 mg/dL — ABNORMAL HIGH (ref 8–23)
CO2: 25 mmol/L (ref 22–32)
Calcium: 8.2 mg/dL — ABNORMAL LOW (ref 8.9–10.3)
Chloride: 104 mmol/L (ref 98–111)
Creatinine, Ser: 1.11 mg/dL (ref 0.61–1.24)
GFR, Estimated: >60 mL/min (ref >60)
Glucose, Bld: 123 mg/dL — ABNORMAL HIGH (ref 70–99)
Potassium: 3.4 mmol/L — ABNORMAL LOW (ref 3.5–5.1)
Sodium: 138 mmol/L (ref 135–145)

## 2024-10-29 LAB — CBC
HCT: 38.7 % — ABNORMAL LOW (ref 39.0–52.0)
Hemoglobin: 13.1 g/dL (ref 13.0–17.0)
MCH: 31.7 pg (ref 26.0–34.0)
MCHC: 33.9 g/dL (ref 30.0–36.0)
MCV: 93.7 fL (ref 80.0–100.0)
Platelets: 176 K/uL (ref 150–400)
RBC: 4.13 MIL/uL — ABNORMAL LOW (ref 4.22–5.81)
RDW: 12.7 % (ref 11.5–15.5)
WBC: 5 K/uL (ref 4.0–10.5)
nRBC: 0 % (ref 0.0–0.2)

## 2024-10-29 LAB — GLUCOSE, CAPILLARY
Glucose-Capillary: 121 mg/dL — ABNORMAL HIGH (ref 70–99)
Glucose-Capillary: 137 mg/dL — ABNORMAL HIGH (ref 70–99)
Glucose-Capillary: 98 mg/dL (ref 70–99)
Glucose-Capillary: 154 mg/dL — ABNORMAL HIGH (ref 70–99)

## 2024-10-29 LAB — HEMOGLOBIN A1C
Hgb A1c MFr Bld: 4.4 % — ABNORMAL LOW (ref 4.8–5.6)
Mean Plasma Glucose: 79.58 mg/dL

## 2024-10-29 LAB — MAGNESIUM: Magnesium: 1.8 mg/dL (ref 1.7–2.4)

## 2024-10-29 MED ORDER — POTASSIUM CHLORIDE CRYS ER 20 MEQ PO TBCR
40.0000 meq | EXTENDED_RELEASE_TABLET | Freq: Once | ORAL | Status: AC
  Administered 2024-10-29: 40 meq via ORAL
  Filled 2024-10-29: qty 2

## 2024-10-29 MED ORDER — MAGNESIUM SULFATE 4 GM/100ML IV SOLN
4.0000 g | Freq: Once | INTRAVENOUS | Status: AC
  Administered 2024-10-29: 4 g via INTRAVENOUS
  Filled 2024-10-29: qty 100

## 2024-10-29 MED ORDER — LACTATED RINGERS IV SOLN: INTRAVENOUS | Status: AC

## 2024-10-29 NOTE — Progress Notes (Signed)
 Attempted to assist pt to the bathroom for BM, he became light headed while walking and had to use the [facility] for BM, assisted back to bed.

## 2024-10-29 NOTE — Progress Notes (Signed)
 Progress Note   Patient: Xavier Moore DOB: 07/16/1946 DOA: 10/28/2024     1 DOS: the patient was seen and examined on 10/29/2024   Brief hospital course: [name] is a 78 year old male with history of hypertension, non-insulin-dependent diabetes mellitus, BPH, GERD, who presents ED for chief concerns of weakness, increasing fall and low appetite over the last 3 to 4 days.  Vitals in the ED showed t of 98, rr 20, hr 97, blood pressure 142/97, SpO2 99% on room air. 5 7 sodium is 138, potassium 3.0, magnesium 0.7 chloride 94, bicarb 24, BUN of 39, serum creatinine 1.56, eGFR 45, nonfasting blood glucose 152, WBC 8.5, hemoglobin 17.2, platelet 235.  UA is negative for leukocytes, negative for nitrates.  COVID/influenza A/influenza B/RSV PCR were negative.  Electrolytes were ordered along with IV fluid.  11/17: Hemodynamically stable, labs with magnesium improved to 1.8, up potassium 3.4, creatinine improved to 1.11.  Giving more potassium and magnesium.  Will continue IV fluid for another day.  PT is recommending SNF.  Patient was without food for the past few days, becoming progressively weaker and called the EMS.  Assessment and Plan: * AKI (acute kidney injury) No prior CKD Likely prerenal with dehydration as he was not eating and drinking for the past few days. - Renal function improving, creatinine now at 1.11 -Giving some more IV fluid -Monitor renal function  Hypokalemia Potassium level improved to 3.4 with repletion. -Replete more potassium Continue to monitor-  Hypomagnesemia Magnesium improved to 1.8. - Giving 2 more gram of magnesium -Continue to monitor  Weakness Suspect secondary to poor p.o. intake leading to acute kidney injury and hypokalemia Fall precaution, aspiration precaution PT, OT -recommending SNF  Diabetes mellitus type 2, noninsulin dependent (HCC) Home metformin 1000 mg daily will not be resumed on admission Insulin  SSI with at bedtime coverage, renal dosing ordered on admission Goal inpatient blood glucose levels 140-180  Benign prostatic hyperplasia with urinary obstruction Patient takes finasteride  5 mg daily, tamsulosin  0.4 mg capsule  Essential hypertension Blood pressure within goal. Patient not on home scheduled antihypertensive medication -As needed hydralazine  GERD (gastroesophageal reflux disease) Home PPI  Protein-calorie malnutrition, moderate Patient appears to be moderately severe malnourished.  Does not have good access to food. -Dietitian consult   Subjective: Patient was sitting comfortably in chair when seen today.  Still feeling weak.  Per patient he was not eaten anything for the past few days as his phone was not working and he could not call anyone to help.  He does not have direct access to food, occasionally call his ride to get groceries.  Lives alone.  No immediate family.  Physical Exam: Vitals:   10/28/24 1458 10/28/24 2009 10/29/24 0407 10/29/24 0758  BP: (!) 152/70 118/74 126/85 (!) 127/58  Pulse: 88  85 74  Resp: 18 16 18 18   Temp: 97.9 F (36.6 C) 98.5 F (36.9 C) 98.3 F (36.8 C) (!) 97.5 F (36.4 C)  TempSrc: Oral   Oral  SpO2: 99% 98% 100% 97%  Weight:      Height:       General.  Frail and malnourished elderly man, in no acute distress. Pulmonary.  Lungs clear bilaterally, normal respiratory effort. CV.  Regular rate and rhythm, no JVD, rub or murmur. Abdomen.  Soft, nontender, nondistended, BS positive. CNS.  Alert and oriented .  No focal neurologic deficit. Extremities.  No edema, no cyanosis, pulses intact and symmetrical. Psychiatry.  Judgment  and insight appears normal.   Data Reviewed: Prior data reviewed  Family Communication: Discussed with patient  Disposition: Status is: Inpatient Remains inpatient appropriate because: Severity of illness  Planned Discharge Destination: Skilled nursing facility  DVT prophylaxis.  Lovenox Time  spent: 45 minutes  This record has been created using Conservation officer, historic buildings. Errors have been sought and corrected,but may not always be located. Such creation errors do not reflect on the standard of care.   Author: Amaryllis Dare, MD 10/29/2024 3:03 PM  For on call review [email or web address].

## 2024-10-29 NOTE — Evaluation (Signed)
 Physical Therapy Evaluation Patient Details Name: Xavier Moore MRN: [id number] DOB: 11-Feb-1946 Today's Date: 10/29/2024  History of Present Illness  [name] is a 78 year old male with history of hypertension, non-insulin-dependent diabetes mellitus, BPH, GERD, who presents ED for chief concerns of weakness, increasing fall and low appetite over the last 3 to 4 days.  Clinical Impression  Co tx with OT for safety. Pt is pleasant 78 y.o. male admitted for hypokalemia. Pt is A&Ox2 and no family available to confirm PLOF. Prior to hospitalization, pt lives alone and able to amb short distances using quad cane. Pt reports ability to perform ADLs IND. Pt demonstrates bed mobility with supervision. Pt lives in a home where he has to navigate a flight of stairs to enter, which could be a barrier to returning home. Pt able to perform STS transfer from varying surfaces with minA-CGA for safety. Pt amb 84ft with CGA-min A using quad cane, however demonstrated 2 LOB requiring min A for correction. Pt demonstrates deficits in strength/balance/activity tolerance affecting his ability to safely perform functional tasks and is at high risk of falling. Would benefit from skilled PT to address above deficits and promote optimal return to PLOF.         If plan is discharge home, recommend the following: A lot of help with walking and/or transfers;A little help with bathing/dressing/bathroom;Direct supervision/assist for medications management;Assistance with cooking/housework;Direct supervision/assist for financial management;Supervision due to cognitive status;Help with stairs or ramp for entrance   Can travel by private vehicle   No    Equipment Recommendations Other (comment) (TBD at next venue)  Recommendations for Other Services       Functional Status Assessment Patient has had a recent decline in their functional status and demonstrates the ability to make significant improvements in  function in a reasonable and predictable amount of time.     Precautions / Restrictions Precautions Precautions: Fall Recall of Precautions/Restrictions: Impaired Restrictions Weight Bearing Restrictions Per Provider Order: No      Mobility  Bed Mobility Overal bed mobility: Needs Assistance Bed Mobility: Supine to Sit     Supine to sit: Supervision     General bed mobility comments: Use of bed rails from supine>sit.    Transfers Overall transfer level: Needs assistance Equipment used: Quad cane Transfers: Sit to/from Stand Sit to Stand: Contact guard assist, Min assist           General transfer comment: Pt able to stand from varying surface heights. Min A from lowest bed height.    Ambulation/Gait Ambulation/Gait assistance: Contact guard assist, Min assist, +2 safety/equipment (chair follow) Gait Distance (Feet): 20 Feet Assistive device: Quad cane Gait Pattern/deviations: Decreased step length - right, Decreased step length - left, Narrow base of support, Knees buckling Gait velocity: dec     General Gait Details: Amb from chair to bathroom. Pt unsteady throughout. 2 LOB while amb requiring min A for correction. Pt able to also use wall to brace self/correct balance.  Stairs            Wheelchair Mobility     Tilt Bed    Modified Rankin (Stroke Patients Only)       Balance Overall balance assessment: Needs assistance Sitting-balance support: Feet supported, No upper extremity supported Sitting balance-Leahy Scale: Normal     Standing balance support: During functional activity, Reliant on assistive device for balance, Single extremity supported Standing balance-Leahy Scale: Fair Standing balance comment: Inc postural sway with static stand  Pertinent Vitals/Pain Pain Assessment Pain Assessment: No/denies pain    Home Living Family/patient expects to be discharged to:: Private residence Living  Arrangements: Alone Available Help at Discharge: Family;Available PRN/intermittently Type of Home: House Home Access: Stairs to enter   [employer] of Steps: 15-16     Home Equipment: Cane - quad      Prior Function Prior Level of Function : History of Falls (last six months);Needs assist             Mobility Comments: Pt reports amb using quad cane for short household distances ADLs Comments: Pt reports IND with ADLs. Pt reports assistance for meals     Extremity/Trunk Assessment   Upper Extremity Assessment Upper Extremity Assessment: Generalized weakness    Lower Extremity Assessment Lower Extremity Assessment: Generalized weakness    Cervical / Trunk Assessment Cervical / Trunk Assessment: Kyphotic  Communication   Communication Communication: No apparent difficulties    Cognition Arousal: Alert Behavior During Therapy: WFL for tasks assessed/performed   PT - Cognitive impairments: No family/caregiver present to determine baseline, No apparent impairments                       PT - Cognition Comments: Pt not oriented to time requiring cuing. Pt states I am just really confused during the sessiong. Pleasant and agreeable Following commands: Intact       Cueing Cueing Techniques: Verbal cues, Visual cues     General Comments      Exercises Other Exercises Other Exercises: Assessment of orthostatics.   Assessment/Plan    PT Assessment Patient needs continued PT services  PT Problem List Decreased strength;Decreased activity tolerance;Decreased balance;Decreased mobility;Decreased coordination;Decreased knowledge of use of DME;Decreased cognition       PT Treatment Interventions DME instruction;Gait training;Stair training;Functional mobility training;Therapeutic activities;Therapeutic exercise;Balance training;Neuromuscular re-education;Patient/family education    PT Goals (Current goals can be found in the Care Plan section)   Acute Rehab PT Goals Patient Stated Goal: to get better PT Goal Formulation: With patient Time For Goal Achievement: 11/12/24 Potential to Achieve Goals: Good    Frequency Min 2X/week     Co-evaluation PT/OT/SLP Co-Evaluation/Treatment: Yes Reason for Co-Treatment: For patient/therapist safety PT goals addressed during session: Mobility/safety with mobility OT goals addressed during session: ADL's and self-care       AM-PAC PT 6 Clicks Mobility  Outcome Measure Help needed turning from your back to your side while in a flat bed without using bedrails?: None Help needed moving from lying on your back to sitting on the side of a flat bed without using bedrails?: A Little Help needed moving to and from a bed to a chair (including a wheelchair)?: A Little Help needed standing up from a chair using your arms (e.g., wheelchair or bedside chair)?: A Little Help needed to walk in hospital room?: A Little Help needed climbing 3-5 steps with a railing? : A Lot 6 Click Score: 18    End of Session Equipment Utilized During Treatment: Gait belt Activity Tolerance: Patient tolerated treatment well Patient left: in chair;with call bell/phone within reach;with chair alarm set Nurse Communication: Mobility status PT Visit Diagnosis: Unsteadiness on feet (R26.81);Muscle weakness (generalized) (M62.81)    Time: 9080-9050 PT Time Calculation (min) (ACUTE ONLY): 30 min   Charges:                 [place]uka Churchill, SPT   Jesenya Bowditch 10/29/2024, 12:55 PM

## 2024-10-29 NOTE — TOC Initial Note (Addendum)
 Transition of Care [facility]) - Initial/Assessment Note    Patient Details  Name: Xavier Moore MRN: [id number] Date of Birth: [birth year]  [facility] CM/SW Contact:    Argenis Kumari L Gene Glazebrook, LCSW Phone Number: 10/29/2024, 10:34 AM  Clinical Narrative:                  [facility] consult received for Home Health/DME. PT and OT will evaluate 11/18. CSW spoke with patient regarding choice. Patient advised that he would like services from [facility].  Referral sent in portal. CSW spoke with liaison, Darleene, and advised of referral. CSW also advised that PT and OT will evaluate tomorrow.   Referral will be reviewed once PT and OT makes their recommendations.     Brief assessment completed. DME discussed. Patient reports that he lives alone. He stated that he is open to receiving DME if there are recommendations. He does not have DME at home that he is currently using.   Patient is A &O X 4. He reports having a PCP, A [address] He uses pharmacy at [facility] on [address] He stated that his sister or brother assists him with transportation to appointments and errands.   SDOH reviewed. Patient reports having no issues with financial or food insecurities.    Patient Goals and CMS Choice            Expected Discharge Plan and Services                                              Prior Living Arrangements/Services                       Activities of Daily Living   ADL Screening (condition at time of admission) Independently performs ADLs?: Yes (appropriate for developmental age) Is the patient deaf or have difficulty hearing?: No Does the patient have difficulty seeing, even when wearing glasses/contacts?: No Does the patient have difficulty concentrating, remembering, or making decisions?: No  Permission Sought/Granted                  Emotional Assessment              Admission diagnosis:  Hypokalemia [E87.6] AKI (acute kidney  injury) [N17.9] Syncope, unspecified syncope type [R55] Patient Active Problem List   Diagnosis Date Noted   Hypokalemia 10/28/2024   Essential hypertension 10/28/2024   GERD (gastroesophageal reflux disease) 10/28/2024   Diabetes mellitus type 2, noninsulin dependent (HCC) 10/28/2024   AKI (acute kidney injury) 10/28/2024   Weakness 10/28/2024   Hypomagnesemia 10/28/2024   Protein-calorie malnutrition, moderate 10/28/2024   Abnormal prostate exam 02/05/2016   History of urinary retention 02/05/2016   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 ([address]), [place] - [address] [place] ([place]) [place] [address] Phone: [phone] Fax: [phone]  [facility] [address] ([address]),  - [address] [place] ([place]) [place] [address] Phone: [phone] Fax: [phone]     Social Drivers of Health (SDOH) Social History: SDOH Screenings   Food  Insecurity: Patient Declined (10/28/2024)  Housing: Unknown (10/28/2024)  Transportation Needs: Patient Declined (10/28/2024)  Utilities: Patient Declined (10/28/2024)  Financial Resource Strain: Medium Risk (05/02/2023)   Received from [facility]  Physical Activity: Insufficiently Active (05/02/2023)   Received from [facility]  Social Connections: Patient Declined (10/28/2024)  Stress: Stress Concern Present (05/02/2023)   Received from [facility]  Tobacco Use: Medium Risk (10/28/2024)   SDOH Interventions:     Readmission Risk Interventions     No data to display

## 2024-10-29 NOTE — Evaluation (Signed)
 Occupational Therapy Evaluation Patient Details Name: Xavier Moore MRN: [id number] DOB: 1946/03/14 Today's Date: 10/29/2024   History of Present Illness   Mr. Estefano Victory is a 78 year old male with history of hypertension, non-insulin-dependent diabetes mellitus, BPH, GERD, who presents ED for chief concerns of weakness, increasing fall and low appetite over the last 3 to 4 days.   Clinical Impressions Mr Mcglasson was seen for OT evaluation this date. Prior to hospital admission, pt was IND. Pt lives alone in a room with 10-15 STE. Pt currently requires CGA + QC for toilet t/f and standing toileting, 1 moderate LOB requiring MOD A to correct with standing grooming.  Pt would benefit from skilled OT to address noted impairments and functional limitations (see below for any additional details). Upon hospital discharge, recommend OT follow up.   Orthostatic Vitals  Lying: BP 127/80, HR 87 Sitting: BP 142/94, HR 92 Standing: BP 139/89, HR 104 3 min Standing: BP 120/73, HR 103 **mildly symptomatic      If plan is discharge home, recommend the following:   A lot of help with bathing/dressing/bathroom;A little help with walking and/or transfers     Functional Status Assessment   Patient has had a recent decline in their functional status and demonstrates the ability to make significant improvements in function in a reasonable and predictable amount of time.     Equipment Recommendations   BSC/3in1 (RW)     Recommendations for Other Services         Precautions/Restrictions   Precautions Precautions: Fall Recall of Precautions/Restrictions: Impaired Restrictions Weight Bearing Restrictions Per Provider Order: No     Mobility Bed Mobility Overal bed mobility: Needs Assistance Bed Mobility: Supine to Sit     Supine to sit: Supervision          Transfers Overall transfer level: Needs assistance Equipment used: Quad cane Transfers: Sit to/from Stand Sit  to Stand: Contact guard assist, Min assist                  Balance Overall balance assessment: Needs assistance Sitting-balance support: Feet supported, No upper extremity supported Sitting balance-Leahy Scale: Normal     Standing balance support: During functional activity, Reliant on assistive device for balance, Single extremity supported Standing balance-Leahy Scale: Fair                             ADL either performed or assessed with clinical judgement   ADL Overall ADL's : Needs assistance/impaired                                       General ADL Comments: CGA + QC for toilet t/f and standing toileting, 1 moderate LOB requiring MOD A to correct with standing grooming.      Pertinent Vitals/Pain Pain Assessment Pain Assessment: No/denies pain     Extremity/Trunk Assessment Upper Extremity Assessment Upper Extremity Assessment: Generalized weakness   Lower Extremity Assessment Lower Extremity Assessment: Generalized weakness       Communication Communication Communication: No apparent difficulties   Cognition Arousal: Alert Behavior During Therapy: WFL for tasks assessed/performed                                 Following commands: Intact       Cueing  General Comments   Cueing Techniques: Verbal cues;Visual cues      Exercises     Shoulder Instructions      Home Living Family/patient expects to be discharged to:: Private residence Living Arrangements: Alone Available Help at Discharge: Family;Available PRN/intermittently Type of Home: House Home Access: Stairs to enter [employer] of Steps: 15-16         Bathroom Shower/Tub: Tub/shower unit         Home Equipment: Cane - quad          Prior Functioning/Environment Prior Level of Function : History of Falls (last six months);Needs assist               ADLs Comments: Pt reports IND with ADLs. Eats out    OT  Problem List: Decreased activity tolerance;Impaired balance (sitting and/or standing)   OT Treatment/Interventions: Self-care/ADL training;Therapeutic exercise;Energy conservation;DME and/or AE instruction;Therapeutic activities;Balance training;Patient/family education      OT Goals(Current goals can be found in the care plan section)   Acute Rehab OT Goals Patient Stated Goal: to go home OT Goal Formulation: With patient Time For Goal Achievement: 11/12/24 Potential to Achieve Goals: Good ADL Goals Pt Will Perform Grooming: standing;Independently Pt Will Perform Lower Body Dressing: with modified independence;sit to/from stand Pt Will Transfer to Toilet: with modified independence;ambulating;regular height toilet   OT Frequency:  Min 2X/week    Co-evaluation   Reason for Co-Treatment: For patient/therapist safety PT goals addressed during session: Mobility/safety with mobility OT goals addressed during session: ADL's and self-care      AM-PAC OT 6 Clicks Daily Activity     Outcome Measure Help from another person eating meals?: None Help from another person taking care of personal grooming?: A Little Help from another person toileting, which includes using toliet, bedpan, or urinal?: A Little Help from another person bathing (including washing, rinsing, drying)?: A Lot Help from another person to put on and taking off regular upper body clothing?: None Help from another person to put on and taking off regular lower body clothing?: A Little 6 Click Score: 19   End of Session    Activity Tolerance: Patient tolerated treatment well Patient left: in chair;with call bell/phone within reach;with chair alarm set  OT Visit Diagnosis: Other abnormalities of gait and mobility (R26.89);Muscle weakness (generalized) (M62.81)                Time: 9080-9051 OT Time Calculation (min): 29 min Charges:  OT General Charges $OT Visit: 1 Visit OT Evaluation $OT Eval Low Complexity:  1 Low OT Treatments $Self Care/Home Management : 8-22 mins  Elston Slot, M.S. OTR/L  10/29/24, 4:52 PM  ascom [phone]

## 2024-10-30 DIAGNOSIS — R531 Weakness: Secondary | ICD-10-CM | POA: Diagnosis not present

## 2024-10-30 DIAGNOSIS — N179 Acute kidney failure, unspecified: Secondary | ICD-10-CM | POA: Diagnosis not present

## 2024-10-30 DIAGNOSIS — E876 Hypokalemia: Secondary | ICD-10-CM | POA: Diagnosis not present

## 2024-10-30 LAB — CBC
HCT: 36.5 % — ABNORMAL LOW (ref 39.0–52.0)
Hemoglobin: 12.8 g/dL — ABNORMAL LOW (ref 13.0–17.0)
MCH: 31.9 pg (ref 26.0–34.0)
MCHC: 35.1 g/dL (ref 30.0–36.0)
MCV: 91 fL (ref 80.0–100.0)
Platelets: 161 K/uL (ref 150–400)
RBC: 4.01 MIL/uL — ABNORMAL LOW (ref 4.22–5.81)
RDW: 12.8 % (ref 11.5–15.5)
WBC: 4.9 K/uL (ref 4.0–10.5)
nRBC: 0 % (ref 0.0–0.2)

## 2024-10-30 LAB — GLUCOSE, CAPILLARY
Glucose-Capillary: 162 mg/dL — ABNORMAL HIGH (ref 70–99)
Glucose-Capillary: 119 mg/dL — ABNORMAL HIGH (ref 70–99)
Glucose-Capillary: 140 mg/dL — ABNORMAL HIGH (ref 70–99)
Glucose-Capillary: 159 mg/dL — ABNORMAL HIGH (ref 70–99)

## 2024-10-30 LAB — RENAL FUNCTION PANEL
Albumin: 3.4 g/dL — ABNORMAL LOW (ref 3.5–5.0)
Anion gap: 8 (ref 5–15)
BUN: 11 mg/dL (ref 8–23)
CO2: 25 mmol/L (ref 22–32)
Calcium: 8.4 mg/dL — ABNORMAL LOW (ref 8.9–10.3)
Chloride: 105 mmol/L (ref 98–111)
Creatinine, Ser: 0.79 mg/dL (ref 0.61–1.24)
GFR, Estimated: >60 mL/min (ref >60)
Glucose, Bld: 137 mg/dL — ABNORMAL HIGH (ref 70–99)
Phosphorus: 1.4 mg/dL — ABNORMAL LOW (ref 2.5–4.6)
Potassium: 3.3 mmol/L — ABNORMAL LOW (ref 3.5–5.1)
Sodium: 138 mmol/L (ref 135–145)

## 2024-10-30 LAB — MAGNESIUM: Magnesium: 1.7 mg/dL (ref 1.7–2.4)

## 2024-10-30 MED ORDER — LOSARTAN POTASSIUM 25 MG PO TABS
25.0000 mg | ORAL_TABLET | Freq: Every day | ORAL | Status: DC
  Administered 2024-10-30 – 2024-11-01 (×3): 25 mg via ORAL
  Filled 2024-10-30 (×3): qty 1

## 2024-10-30 MED ORDER — ADULT MULTIVITAMIN W/MINERALS CH
1.0000 | ORAL_TABLET | Freq: Every day | ORAL | Status: DC
  Administered 2024-10-31 – 2024-11-02 (×3): 1 via ORAL
  Filled 2024-10-30 (×3): qty 1

## 2024-10-30 MED ORDER — ENSURE PLUS HIGH PROTEIN PO LIQD
237.0000 mL | Freq: Three times a day (TID) | ORAL | Status: DC
  Administered 2024-10-30 – 2024-11-02 (×8): 237 mL via ORAL

## 2024-10-30 MED ORDER — THIAMINE HCL 100 MG PO TABS
100.0000 mg | ORAL_TABLET | Freq: Every day | ORAL | Status: DC
  Administered 2024-10-31 – 2024-11-02 (×3): 100 mg via ORAL
  Filled 2024-10-30 (×6): qty 1

## 2024-10-30 MED ORDER — K PHOS MONO-SOD PHOS DI & MONO 155-852-130 MG PO TABS
500.0000 mg | ORAL_TABLET | Freq: Two times a day (BID) | ORAL | Status: AC
  Administered 2024-10-30 (×2): 500 mg via ORAL
  Filled 2024-10-30 (×2): qty 2

## 2024-10-30 MED ORDER — MAGNESIUM SULFATE 2 GM/50ML IV SOLN
2.0000 g | Freq: Once | INTRAVENOUS | Status: AC
  Administered 2024-10-30: 2 g via INTRAVENOUS
  Filled 2024-10-30: qty 50

## 2024-10-30 NOTE — Plan of Care (Signed)
  Problem: Fluid Volume: Goal: Ability to maintain a balanced intake and output will improve Outcome: Progressing   Problem: Tissue Perfusion: Goal: Adequacy of tissue perfusion will improve Outcome: Progressing   Problem: Clinical Measurements: Goal: Will remain free from infection Outcome: Progressing

## 2024-10-30 NOTE — Assessment & Plan Note (Signed)
 Phosphorus of 1.4. - Replace phosphorous and monitor

## 2024-10-30 NOTE — Assessment & Plan Note (Signed)
 Suspect secondary to poor p.o. intake leading to acute kidney injury and hypokalemia Fall precaution, aspiration precaution PT, OT -recommending SNF

## 2024-10-30 NOTE — Assessment & Plan Note (Signed)
 Resolved.  No prior CKD Likely prerenal with dehydration as he was not eating and drinking for the past few days. - Creatinine now at 0.79 -Monitor renal function

## 2024-10-30 NOTE — TOC Progression Note (Signed)
 Transition of Care [facility]) - Progression Note    Patient Details  Name: Xavier Moore MRN: [id number] Date of Birth: 1946/05/24  [facility] CM/SW Contact  Daved JONETTA Hamilton, RN Phone Number: 10/30/2024, 4:53 PM  Clinical Narrative:     Met with patient and introduced self. Presented bed offers to patient. Patient verbalized he wants the highest rated facility.  [facility] selected in [place]. Briana with [facility] attempted to be notified, lvmm. Requested that [place] with CM start auth.                     Expected Discharge Plan and Services                                               Social Drivers of Health (SDOH) Interventions SDOH Screenings   Food Insecurity: Patient Declined (10/28/2024)  Housing: Unknown (10/28/2024)  Transportation Needs: Patient Declined (10/28/2024)  Utilities: Patient Declined (10/28/2024)  Financial Resource Strain: Medium Risk (05/02/2023)   Received from [facility]  Physical Activity: Insufficiently Active (05/02/2023)   Received from [facility]  Social Connections: Patient Declined (10/28/2024)  Stress: Stress Concern Present (05/02/2023)   Received from [facility]  Tobacco Use: Medium Risk (10/28/2024)    Readmission Risk Interventions     No data to display

## 2024-10-30 NOTE — Care Management Important Message (Signed)
 Important Message  Patient Details  Name: Xavier Moore MRN: [id number] Date of Birth: 1946/10/24   Important Message Given:  Yes - Medicare IM     Xavier Moore 10/30/2024, 4:39 PM

## 2024-10-30 NOTE — TOC Progression Note (Signed)
 Transition of Care [facility]) - Progression Note    Patient Details  Name: Xavier Moore MRN: [id number] Date of Birth: Jul 25, 1946  Transition of Care [facility]) CM/SW Contact  Daved JONETTA Hamilton, RN Phone Number: 10/30/2024, 11:32 AM  Clinical Narrative:     Need to obtain PASRR   FL2 sent for signature, referrals sent in HUB. TOC will offer choice to patient, prior shara will be required once SNF chosen.TOC will continue to follow.                    Expected Discharge Plan and Services                                               Social Drivers of Health (SDOH) Interventions SDOH Screenings   Food Insecurity: Patient Declined (10/28/2024)  Housing: Unknown (10/28/2024)  Transportation Needs: Patient Declined (10/28/2024)  Utilities: Patient Declined (10/28/2024)  Financial Resource Strain: Medium Risk (05/02/2023)   Received from [facility]  Physical Activity: Insufficiently Active (05/02/2023)   Received from [facility]  Social Connections: Patient Declined (10/28/2024)  Stress: Stress Concern Present (05/02/2023)   Received from [facility]  Tobacco Use: Medium Risk (10/28/2024)    Readmission Risk Interventions     No data to display

## 2024-10-30 NOTE — Progress Notes (Signed)
 Physical Therapy Treatment Patient Details Name: Xavier Moore MRN: [id number] DOB: [birth year] Today's Date: 10/30/2024   History of Present Illness [name] is a 78 year old male with history of hypertension, non-insulin-dependent diabetes mellitus, BPH, GERD, who presents ED for chief concerns of weakness, increasing fall and low appetite over the last 3 to 4 days.    PT Comments  Pt demonstrates improved balance when given RW to inc BOS. Pt continues to require cuing for RW safety with transfers and positioning of RW while amb. Able to amb 131ft using RW and chair follow for safety. Pt did not endorse dizziness throughout session. CGA provided to bathroom. Verbal cues provided and pt with inc understanding. Pt still at high risk of falls, and would benefit from continued skilled PT to promote independence and safety with functional tasks.    If plan is discharge home, recommend the following: A little help with walking and/or transfers;A lot of help with bathing/dressing/bathroom;Assistance with cooking/housework;Assistance with feeding;Help with stairs or ramp for entrance   Can travel by private vehicle     No  Equipment Recommendations  Other (comment) (TBD at next venue. RW if dc home)    Recommendations for Other Services       Precautions / Restrictions Precautions Precautions: Fall Recall of Precautions/Restrictions: Impaired Restrictions Weight Bearing Restrictions Per Provider Order: No     Mobility  Bed Mobility Overal bed mobility: Needs Assistance Bed Mobility: Supine to Sit     Supine to sit: Supervision     General bed mobility comments: Use of bed rails from supine>sit.    Transfers Overall transfer level: Needs assistance Equipment used: Rolling walker (2 wheels) Transfers: Sit to/from Stand Sit to Stand: Contact guard assist           General transfer comment: max cuing for hand placement and technique     Ambulation/Gait Ambulation/Gait assistance: Contact guard assist, +2 safety/equipment (chair follow) Gait Distance (Feet): 120 Feet Assistive device: Rolling walker (2 wheels) Gait Pattern/deviations: Decreased step length - right, Decreased step length - left, Narrow base of support Gait velocity: dec     General Gait Details: Amb with chair follow. Pt with improved balance with increased support. No endorsement of dizziness during session.   Stairs             Wheelchair Mobility     Tilt Bed    Modified Rankin (Stroke Patients Only)       Balance Overall balance assessment: Needs assistance Sitting-balance support: Feet supported, No upper extremity supported Sitting balance-Leahy Scale: Normal     Standing balance support: Bilateral upper extremity supported, During functional activity, Reliant on assistive device for balance Standing balance-Leahy Scale: Fair Standing balance comment: Improved balance with inc support                            Communication Communication Communication: No apparent difficulties  Cognition Arousal: Alert Behavior During Therapy: WFL for tasks assessed/performed   PT - Cognitive impairments: No family/caregiver present to determine baseline                       PT - Cognition Comments: Pt continues to state that he is confused about his hospitalization. Following commands: Intact      Cueing Cueing Techniques: Verbal cues, Visual cues  Exercises Other Exercises Other Exercises: CGA to bathroom with use of RW. Other Exercises: Seated marches, LAQ, heel rockers  General Comments        Pertinent Vitals/Pain Pain Assessment Pain Assessment: No/denies pain    Home Living                          Prior Function            PT Goals (current goals can now be found in the care plan section) Acute Rehab PT Goals Patient Stated Goal: to get better PT Goal Formulation: With  patient Time For Goal Achievement: 11/12/24 Potential to Achieve Goals: Good    Frequency    Min 2X/week      PT Plan      Co-evaluation              AM-PAC PT 6 Clicks Mobility   Outcome Measure  Help needed turning from your back to your side while in a flat bed without using bedrails?: None Help needed moving from lying on your back to sitting on the side of a flat bed without using bedrails?: A Little Help needed moving to and from a bed to a chair (including a wheelchair)?: A Little Help needed standing up from a chair using your arms (e.g., wheelchair or bedside chair)?: A Little Help needed to walk in hospital room?: A Little Help needed climbing 3-5 steps with a railing? : A Lot 6 Click Score: 18    End of Session Equipment Utilized During Treatment: Gait belt Activity Tolerance: Patient tolerated treatment well Patient left: in chair;with call bell/phone within reach;with chair alarm set Nurse Communication: Mobility status PT Visit Diagnosis: Unsteadiness on feet (R26.81);Muscle weakness (generalized) (M62.81)     Time: 9058-8996 PT Time Calculation (min) (ACUTE ONLY): 22 min  Charges:                            Pranshu Lyster, SPT    Cailyn Houdek 10/30/2024, 1:23 PM

## 2024-10-30 NOTE — Progress Notes (Signed)
 Progress Note   Patient: Xavier Moore FMW:[id number] DOB: 1946-10-09 DOA: 10/28/2024     2 DOS: the patient was seen and examined on 10/30/2024   Brief hospital course: [name] is a 78 year old male with history of hypertension, non-insulin-dependent diabetes mellitus, BPH, GERD, who presents ED for chief concerns of weakness, increasing fall and low appetite over the last 3 to 4 days.  Vitals in the ED showed t of 98, rr 20, hr 97, blood pressure 142/97, SpO2 99% on room air. 5 7 sodium is 138, potassium 3.0, magnesium 0.7 chloride 94, bicarb 24, BUN of 39, serum creatinine 1.56, eGFR 45, nonfasting blood glucose 152, WBC 8.5, hemoglobin 17.2, platelet 235.  UA is negative for leukocytes, negative for nitrates.  COVID/influenza A/influenza B/RSV PCR were negative.  Electrolytes were ordered along with IV fluid.  11/17: Hemodynamically stable, labs with magnesium improved to 1.8, up potassium 3.4, creatinine improved to 1.11.  Giving more potassium and magnesium.  Will continue IV fluid for another day.  PT is recommending SNF.  Patient was without food for the past few days, becoming progressively weaker and called the EMS.  11/18: Vital stable, labs with hypophosphatemia at 1.4, hypokalemia 3.3 and borderline magnesium at 1.7-electrolytes are being repleted. TOC working on placement.  Assessment and Plan: * AKI (acute kidney injury) Resolved.  No prior CKD Likely prerenal with dehydration as he was not eating and drinking for the past few days. - Creatinine now at 0.79 -Monitor renal function  Hypokalemia Potassium at 3.3 -Replete more potassium Continue to monitor-  Hypophosphatemia Phosphorus of 1.4. - Replace phosphorous and monitor  Hypomagnesemia Magnesium at 1.7 - Giving 2 more gram of magnesium -Continue to monitor  Weakness Suspect secondary to poor p.o. intake leading to acute kidney injury and hypokalemia Fall precaution, aspiration  precaution PT, OT -recommending SNF  Diabetes mellitus type 2, noninsulin dependent (HCC) CBG within goal, was on metformin at home -Continue with SSI  Benign prostatic hyperplasia with urinary obstruction Patient takes finasteride  5 mg daily, tamsulosin  0.4 mg capsule  Essential hypertension Blood pressure mildly elevated Patient was not taking any antihypertensives at home. -Starting on low-dose losartan -As needed hydralazine  GERD (gastroesophageal reflux disease) Home PPI  Protein-calorie malnutrition, moderate Patient appears to be moderately severe malnourished.  Does not have good access to food. -Dietitian consult   Subjective: Patient was sitting comfortably in chair when seen today.  No new concern.  He agreed to go to rehab to become little stronger.  Physical Exam: Vitals:   10/29/24 1541 10/29/24 2008 10/30/24 0349 10/30/24 0825  BP: 138/87 (!) 145/90 (!) 140/81 (!) 146/80  Pulse: 74 89 80 91  Resp: 18 17 16 18   Temp: 97.7 F (36.5 C) 98.3 F (36.8 C) 97.9 F (36.6 C) (!) 97.4 F (36.3 C)  TempSrc: Oral Oral Oral Oral  SpO2: 98% 99% 95%   Weight:      Height:       General.  Frail and malnourished elderly man, in no acute distress. Pulmonary.  Lungs clear bilaterally, normal respiratory effort. CV.  Regular rate and rhythm, no JVD, rub or murmur. Abdomen.  Soft, nontender, nondistended, BS positive. CNS.  Alert and oriented .  No focal neurologic deficit. Extremities.  No edema,  pulses intact and symmetrical.   Data Reviewed: Prior data reviewed  Family Communication: Discussed with patient  Disposition: Status is: Inpatient Remains inpatient appropriate because: Severity of illness  Planned Discharge Destination: Skilled nursing facility  DVT prophylaxis.  Lovenox Time spent: 44 minutes  This record has been created using Conservation officer, historic buildings. Errors have been sought and corrected,but may not always be located. Such creation  errors do not reflect on the standard of care.   Author: Amaryllis Dare, MD 10/30/2024 3:19 PM  For on call review [email or web address].

## 2024-10-30 NOTE — Assessment & Plan Note (Signed)
 Blood pressure mildly elevated Patient was not taking any antihypertensives at home. -Starting on low-dose losartan -As needed hydralazine

## 2024-10-30 NOTE — Assessment & Plan Note (Signed)
 CBG within goal, was on metformin at home -Continue with SSI

## 2024-10-30 NOTE — Progress Notes (Signed)
 Occupational Therapy Treatment Patient Details Name: Xavier Moore MRN: [id number] DOB: 27-Jan-1946 Today's Date: 10/30/2024   History of present illness [name] is a 78 year old male with history of hypertension, non-insulin-dependent diabetes mellitus, BPH, GERD, who presents ED for chief concerns of weakness, increasing fall and low appetite over the last 3 to 4 days.   OT comments  Patient seen for OT treatment on this date. Upon arrival to room patient resting in bed, agreeable to treatment. Patient reports his primary concern is his UB weakness and is agreeable to address with OT.  Patient was able to demonstrate bed mobility with flat bed and without using bedrails. See below for exercises performed; patient performed all while sitting unsupported EOB with cues throughout to maintain scapular retraction for postural stability with good return demo. Patient performed toileting with urinal while in stance, noted slight balance deficits, 2 exercises performed in standing with CGA provided for balance training and standing tolerance (arm circles in B directions/20 reps and horizontal abduction/adduction).  Patient ended treatment in bed with bed/chair alarm on and all needs within reach. Patient making good progress toward goals, will continue to follow POC. Discharge recommendation remains appropriate.        If plan is discharge home, recommend the following:  A lot of help with bathing/dressing/bathroom;A little help with walking and/or transfers   Equipment Recommendations  Other (comment) (defer to next venue)    Recommendations for Other Services      Precautions / Restrictions Precautions Precautions: Fall Recall of Precautions/Restrictions: Impaired Restrictions Weight Bearing Restrictions Per Provider Order: No       Mobility Bed Mobility Overal bed mobility: Needs Assistance Bed Mobility: Rolling, Supine to Sit, Sit to Supine Rolling: Modified independent  (Device/Increase time)   Supine to sit: Modified independent (Device/Increase time) Sit to supine: Modified independent (Device/Increase time)   General bed mobility comments: able to transfer from supine to EOB with bed flat and no hand rails    Transfers Overall transfer level: Needs assistance   Transfers: Sit to/from Stand Sit to Stand: Supervision                 Balance Overall balance assessment: Needs assistance Sitting-balance support: Feet supported, No upper extremity supported Sitting balance-Leahy Scale: Normal     Standing balance support: No upper extremity supported, During functional activity Standing balance-Leahy Scale: Fair Standing balance comment: remained in stance to use urinal with CGA                           ADL either performed or assessed with clinical judgement   ADL Overall ADL's : Needs assistance/impaired                             Toileting- Clothing Manipulation and Hygiene: Supervision/safety;Contact guard assist Toileting - Clothing Manipulation Details (indicate cue type and reason): standing at bedside to use urinal            Extremity/Trunk Assessment Upper Extremity Assessment Upper Extremity Assessment: Generalized weakness   Lower Extremity Assessment Lower Extremity Assessment: Generalized weakness        Vision       Perception     Praxis     Communication Communication Communication: No apparent difficulties   Cognition Arousal: Alert Behavior During Therapy: [facility] for tasks assessed/performed  Following commands: Intact        Cueing   Cueing Techniques: Verbal cues, Visual cues  Exercises Exercises: Other exercises Other Exercises Other Exercises: core stability exercises: neck flexion/extension, rotation, trunk rotation, AAROM shoulder flexion, forward flexion to stretch BLE, resistasnce in shoulder extension, resistance in  elbow flexion, arm circles, horizontal abduction/adduction; standing arm circles and horizontal abduction/adduction    Shoulder Instructions       General Comments      Pertinent Vitals/ Pain       Pain Assessment Pain Assessment: No/denies pain  Home Living                                          Prior Functioning/Environment              Frequency  Min 2X/week        Progress Toward Goals  OT Goals(current goals can now be found in the care plan section)  Progress towards OT goals: Progressing toward goals  Acute Rehab OT Goals Patient Stated Goal: to get stronger OT Goal Formulation: With patient Time For Goal Achievement: 11/12/24 Potential to Achieve Goals: Good ADL Goals Pt Will Perform Grooming: standing;Independently Pt Will Perform Lower Body Dressing: with modified independence;sit to/from stand Pt Will Transfer to Toilet: with modified independence;ambulating;regular height toilet  Plan      Co-evaluation                 AM-PAC OT 6 Clicks Daily Activity     Outcome Measure   Help from another person eating meals?: None Help from another person taking care of personal grooming?: A Little Help from another person toileting, which includes using toliet, bedpan, or urinal?: A Little Help from another person bathing (including washing, rinsing, drying)?: A Lot Help from another person to put on and taking off regular upper body clothing?: None Help from another person to put on and taking off regular lower body clothing?: A Little 6 Click Score: 19    End of Session    OT Visit Diagnosis: Other abnormalities of gait and mobility (R26.89);Muscle weakness (generalized) (M62.81)   Activity Tolerance Patient tolerated treatment well   Patient Left in bed;with call bell/phone within reach;with bed alarm set   Nurse Communication          Time: 8675-8652 OT Time Calculation (min): 23 min  Charges: OT General  Charges $OT Visit: 1 Visit OT Treatments $Self Care/Home Management : 23-37 mins  Rogers Clause, OT/L MSOT, 10/30/2024

## 2024-10-30 NOTE — TOC Progression Note (Signed)
 Transition of Care [facility]) - Progression Note    Patient Details  Name: Xavier Moore MRN: [id number] Date of Birth: [birth year]  Transition of Care [facility]) CM/SW Contact  Corean ONEIDA Haddock, RN Phone Number: 10/30/2024, 1:47 PM  Clinical Narrative:     Cecile obtained [phone] A                    Expected Discharge Plan and Services                                               Social Drivers of Health (SDOH) Interventions SDOH Screenings   Food Insecurity: Patient Declined (10/28/2024)  Housing: Unknown (10/28/2024)  Transportation Needs: Patient Declined (10/28/2024)  Utilities: Patient Declined (10/28/2024)  Financial Resource Strain: Medium Risk (05/02/2023)   Received from [facility]  Physical Activity: Insufficiently Active (05/02/2023)   Received from [facility]  Social Connections: Patient Declined (10/28/2024)  Stress: Stress Concern Present (05/02/2023)   Received from [facility]  Tobacco Use: Medium Risk (10/28/2024)    Readmission Risk Interventions     No data to display

## 2024-10-30 NOTE — Assessment & Plan Note (Signed)
 Magnesium at 1.7 - Giving 2 more gram of magnesium -Continue to monitor

## 2024-10-30 NOTE — Progress Notes (Signed)
 Mobility Specialist - Progress Note   10/30/24 1147  Mobility  Activity Ambulated with assistance;Stood at bedside;Pivoted/transferred from chair to bed  Level of Assistance Contact guard assist, steadying assist  Assistive Device None  Distance Ambulated (ft) 6 ft  Activity Response Tolerated well  Mobility visit 1 Mobility  Mobility Specialist Start Time (ACUTE ONLY) 1131  Mobility Specialist Stop Time (ACUTE ONLY) 1146  Mobility Specialist Time Calculation (min) (ACUTE ONLY) 15 min   Pt sitting in the recliner upon entry, utilizing RA. MS assisted NT w/ bath. Pt amb around the bed CGA-MinG and returned supine with alarm set and needs within reach.  America Silvan Mobility Specialist 10/30/24 11:52 AM

## 2024-10-30 NOTE — Assessment & Plan Note (Signed)
 Potassium at 3.3 -Replete more potassium Continue to monitor-

## 2024-10-30 NOTE — NC FL2 (Signed)
 [place]  MEDICAID [id number] LEVEL OF CARE FORM     IDENTIFICATION  Patient Name: Sirius Woodford Birthdate: 26-Jun-1946 Sex: male Admission Date (Current Location): 10/28/2024  [facility] and [place] Number:  Chiropodist and Address:  [facility], [address], [place], [place] [address]      Provider Number: [id number]  Attending Physician Name and Address:  Caleen Qualia, MD  Relative Name and Phone Number:  Alani Lacivita sister [phone]    Current Level of Care: Hospital Recommended Level of Care: Skilled Nursing Facility Prior Approval Number:    Date Approved/Denied:   PASRR Number:    Discharge Plan: SNF    Current Diagnoses: Patient Active Problem List   Diagnosis Date Noted   Hypokalemia 10/28/2024   Essential hypertension 10/28/2024   GERD (gastroesophageal reflux disease) 10/28/2024   Diabetes mellitus type 2, noninsulin dependent (HCC) 10/28/2024   AKI (acute kidney injury) 10/28/2024   Weakness 10/28/2024   Hypomagnesemia 10/28/2024   Protein-calorie malnutrition, moderate 10/28/2024   Abnormal prostate exam 02/05/2016   History of urinary retention 02/05/2016   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   BPH with obstruction/lower urinary tract symptoms 05/25/2015   Elevated PSA 05/25/2015   Elevated prostate specific antigen (PSA) 05/25/2015   Benign prostatic hyperplasia with urinary obstruction 05/25/2015    Orientation RESPIRATION BLADDER Height & Weight     Self, Situation, Place  Normal Continent Weight: 77.1 kg Height:  6' 1 (185.4 cm)  BEHAVIORAL SYMPTOMS/MOOD NEUROLOGICAL BOWEL NUTRITION STATUS      Continent Diet (Renal/carb modified w/ fluid restriction )  AMBULATORY STATUS COMMUNICATION OF NEEDS Skin   Extensive Assist Verbally Normal                       Personal Care Assistance Level of Assistance  Bathing, Dressing Bathing Assistance: Limited assistance   Dressing  Assistance: Limited assistance     Functional Limitations Info  Sight, Hearing Sight Info: Impaired Hearing Info: Impaired      SPECIAL CARE FACTORS FREQUENCY  PT (By licensed PT), OT (By licensed OT)     PT Frequency: 5x week OT Frequency: 5x week            Contractures Contractures Info: Not present    Additional Factors Info  Code Status, Allergies Code Status Info: FULL Allergies Info: no known allergies           Current Medications (10/30/2024):  This is the current hospital active medication list Current Facility-Administered Medications  Medication Dose Route Frequency Provider Last Rate Last Admin   acetaminophen (TYLENOL) tablet 650 mg  650 mg Oral Q6H PRN Cox, Amy N, DO       Or   acetaminophen (TYLENOL) suppository 650 mg  650 mg Rectal Q6H PRN Cox, Amy N, DO       enoxaparin (LOVENOX) injection 40 mg  40 mg Subcutaneous Q24H Cox, Amy N, DO   40 mg at 10/29/24 1655   finasteride  (PROSCAR ) tablet 5 mg  5 mg Oral Daily Cox, Amy N, DO   5 mg at 10/30/24 9090   hydrALAZINE (APRESOLINE) injection 5 mg  5 mg Intravenous Q6H PRN Cox, Amy N, DO       insulin aspart (novoLOG) injection 0-5 Units  0-5 Units Subcutaneous QHS Cox, Amy N, DO       insulin aspart (novoLOG) injection 0-9 Units  0-9 Units Subcutaneous TID WC Cox, Amy N, DO   2  Units at 10/30/24 0908   ondansetron (ZOFRAN) tablet 4 mg  4 mg Oral Q6H PRN Cox, Amy N, DO       Or   ondansetron (ZOFRAN) injection 4 mg  4 mg Intravenous Q6H PRN Cox, Amy N, DO       pantoprazole (PROTONIX) EC tablet 40 mg  40 mg Oral Daily Cox, Amy N, DO   40 mg at 10/30/24 9090   phosphorus (K PHOS NEUTRAL) tablet 500 mg  500 mg Oral BID Amin, Sumayya, MD       senna-docusate (Senokot-S) tablet 1 tablet  1 tablet Oral QHS PRN Cox, Amy N, DO       tamsulosin  (FLOMAX ) capsule 0.4 mg  0.4 mg Oral QPC supper Cox, Amy N, DO   0.4 mg at 10/29/24 1655     Discharge Medications: Please see discharge summary for a list of discharge  medications.  Relevant Imaging Results:  Relevant Lab Results:   Additional Information SS# [id number]  Daved JONETTA Hamilton, RN

## 2024-10-31 DIAGNOSIS — E43 Unspecified severe protein-calorie malnutrition: Secondary | ICD-10-CM | POA: Insufficient documentation

## 2024-10-31 DIAGNOSIS — N179 Acute kidney failure, unspecified: Secondary | ICD-10-CM | POA: Diagnosis not present

## 2024-10-31 LAB — GLUCOSE, CAPILLARY
Glucose-Capillary: 135 mg/dL — ABNORMAL HIGH (ref 70–99)
Glucose-Capillary: 201 mg/dL — ABNORMAL HIGH (ref 70–99)
Glucose-Capillary: 135 mg/dL — ABNORMAL HIGH (ref 70–99)
Glucose-Capillary: 224 mg/dL — ABNORMAL HIGH (ref 70–99)

## 2024-10-31 LAB — RENAL FUNCTION PANEL
Albumin: 3.5 g/dL (ref 3.5–5.0)
Anion gap: 7 (ref 5–15)
BUN: 14 mg/dL (ref 8–23)
CO2: 27 mmol/L (ref 22–32)
Calcium: 8.8 mg/dL — ABNORMAL LOW (ref 8.9–10.3)
Chloride: 103 mmol/L (ref 98–111)
Creatinine, Ser: 0.9 mg/dL (ref 0.61–1.24)
GFR, Estimated: >60 mL/min (ref >60)
Glucose, Bld: 112 mg/dL — ABNORMAL HIGH (ref 70–99)
Phosphorus: 2 mg/dL — ABNORMAL LOW (ref 2.5–4.6)
Potassium: 3.7 mmol/L (ref 3.5–5.1)
Sodium: 137 mmol/L (ref 135–145)

## 2024-10-31 LAB — IRON AND TIBC
Iron: 79 ug/dL (ref 45–182)
Saturation Ratios: 30 % (ref 17.9–39.5)
TIBC: 262 ug/dL (ref 250–450)
UIBC: 183 ug/dL

## 2024-10-31 LAB — MAGNESIUM: Magnesium: 1.5 mg/dL — ABNORMAL LOW (ref 1.7–2.4)

## 2024-10-31 LAB — VITAMIN D 25 HYDROXY (VIT D DEFICIENCY, FRACTURES): Vit D, 25-Hydroxy: 23.16 ng/mL — ABNORMAL LOW (ref 30–100)

## 2024-10-31 LAB — FOLATE: Folate: 7.6 ng/mL (ref >5.9)

## 2024-10-31 LAB — VITAMIN B12: Vitamin B-12: 260 pg/mL (ref 180–914)

## 2024-10-31 MED ORDER — POTASSIUM PHOSPHATES 15 MMOLE/5ML IV SOLN
30.0000 mmol | Freq: Once | INTRAVENOUS | Status: AC
  Administered 2024-10-31: 30 mmol via INTRAVENOUS
  Filled 2024-10-31: qty 10

## 2024-10-31 MED ORDER — MAGNESIUM SULFATE 4 GM/100ML IV SOLN
4.0000 g | Freq: Once | INTRAVENOUS | Status: AC
  Administered 2024-10-31: 4 g via INTRAVENOUS
  Filled 2024-10-31: qty 100

## 2024-10-31 MED ORDER — CARVEDILOL 6.25 MG PO TABS
6.2500 mg | ORAL_TABLET | Freq: Two times a day (BID) | ORAL | Status: DC
  Administered 2024-10-31 – 2024-11-01 (×3): 6.25 mg via ORAL
  Filled 2024-10-31 (×4): qty 1

## 2024-10-31 NOTE — Plan of Care (Signed)

## 2024-10-31 NOTE — Progress Notes (Signed)
 Progress Note   Patient: Xavier Moore FMW:[id number] DOB: 10/02/1946 DOA: 10/28/2024     3 DOS: the patient was seen and examined on 10/31/2024   Brief hospital course: Xavier Moore is a 78 year old male with history of hypertension, non-insulin -dependent diabetes mellitus, BPH, GERD, who presents ED for chief concerns of weakness, increasing fall and low appetite over the last 3 to 4 days.  Vitals in the ED showed t of 98, rr 20, hr 97, blood pressure 142/97, SpO2 99% on room air. 5 7 sodium is 138, potassium 3.0, magnesium  0.7 chloride 94, bicarb 24, BUN of 39, serum creatinine 1.56, eGFR 45, nonfasting blood glucose 152, WBC 8.5, hemoglobin 17.2, platelet 235.  UA is negative for leukocytes, negative for nitrates.  COVID/influenza A/influenza B/RSV PCR were negative.  Electrolytes were ordered along with IV fluid.  11/17: Hemodynamically stable, labs with magnesium  improved to 1.8, up potassium 3.4, creatinine improved to 1.11.  Giving more potassium and magnesium .  Will continue IV fluid for another day.  PT is recommending SNF.  Patient was without food for the past few days, becoming progressively weaker and called the EMS.  11/18: Vital stable, labs with hypophosphatemia at 1.4, hypokalemia 3.3 and borderline magnesium  at 1.7-electrolytes are being repleted. TOC working on placement.  Assessment and Plan:  # AKI (acute kidney injury) Resolved.  No prior CKD Likely prerenal with dehydration as he was not eating and drinking for the past few days. - sCr 0.90 -Monitor renal function  Hypokalemia Potassium 3.3>3.7 -Replete more potassium Continue to monitor-  Hypophosphatemia Phosphorus of 1.4>2.0 - Replace phosphorous and monitor  Hypomagnesemia Magnesium  1.7>1.5 - Giving 2 more gram of magnesium  -Continue to monitor  Weakness Suspect secondary to poor p.o. intake leading to acute kidney injury and hypokalemia Fall precaution, aspiration precaution PT,  OT -recommending SNF  Diabetes mellitus type 2, noninsulin dependent (HCC) CBG within goal, was on metformin at home -Continue with SSI  Benign prostatic hyperplasia with urinary obstruction Patient takes finasteride  5 mg daily, tamsulosin  0.4 mg capsule  Essential hypertension Blood pressure mildly elevated Patient was not taking any antihypertensives at home. -11/18 Started Losartan  25 mg p.o. day 11/19 started Coreg  6.25 mg p.o. twice daily -As needed hydralazine  Monitor BP and titrate medications accordingly  GERD (gastroesophageal reflux disease) Home PPI  Protein-calorie malnutrition, moderate Patient appears to be moderately severe malnourished.  Does not have good access to food. -Dietitian consult   Subjective: No significant events overnight.  Patient was resting up in the bed, overall patient is feeling better, denied any specific complaints.   Physical Exam: Vitals:   10/31/24 0357 10/31/24 0413 10/31/24 0425 10/31/24 0816  BP: (!) 158/101 (!) 156/92 (!) 144/97 (!) 147/96  Pulse: (!) 106 93 85 76  Resp: 19   16  Temp: 99 F (37.2 C)   98 F (36.7 C)  TempSrc: Oral     SpO2: 99% 96%  97%  Weight:      Height:       General.  Frail and malnourished elderly man, in no acute distress. Pulmonary.  Lungs clear bilaterally, normal respiratory effort. CV.  Regular rate and rhythm, no JVD, rub or murmur. Abdomen.  Soft, nontender, nondistended, BS positive. CNS.  Alert and oriented .  No focal neurologic deficit. Extremities.  No edema,  pulses intact and symmetrical.   Data Reviewed: Prior data reviewed  Family Communication: Discussed with patient  Disposition: Status is: Inpatient Remains inpatient appropriate because: Severity of illness  Planned Discharge Destination: Skilled nursing facility  DVT prophylaxis.  Lovenox Time spent: 55 minutes  This record has been created using Conservation officer, historic buildings. Errors have been sought and  corrected,but may not always be located. Such creation errors do not reflect on the standard of care.   Author: Elvan Sor, MD 10/31/2024 5:23 PM  For on call review [email or web address].

## 2024-10-31 NOTE — Progress Notes (Signed)
 Initial Nutrition Assessment  DOCUMENTATION CODES:   Severe malnutrition in context of social or environmental circumstances  INTERVENTION:   Ensure Plus High Protein po TID, each supplement provides 350 kcal and 20 grams of protein  MVI po daily   Thiamine 100mg  po daily x 7 days   Pt at high refeed risk; recommend monitor potassium, magnesium and phosphorus labs daily until stable  Daily weights   NUTRITION DIAGNOSIS:   Severe Malnutrition related to social / environmental circumstances as evidenced by severe fat depletion, severe muscle depletion.  GOAL:   Patient will meet greater than or equal to 90% of their needs  MONITOR:   PO intake, Supplement acceptance, Labs, Weight trends, I & O's, Skin  REASON FOR ASSESSMENT:   Consult Assessment of nutrition requirement/status  ASSESSMENT:   78 y/o male with h/o BPH, marijuana use, HTN, GERD and DM who is admitted with weakness, poor oral intake and AKI.  Met with pt in room today. Pt reports good appetite and oral intake at baseline but reports poor access to food. Pt reports that he has been having transportation issues and has not been able to go get groceries for several months. Per chart, pt is down 40lbs(28%) over the past 7 months; this is significant weight loss. Pt is eating 100% of meals in hospital and is drinking the Ensure supplements. Pt is actively refeeding; electrolytes are being monitored and supplemented. Pt is receiving thiamine. Plan is for SNF at discharge.   Medications reviewed and include: lovenox, insulin, MVI, protonix, thiamine, KPhos   Labs reviewed: K 3.7 wnl, P 2.0(L), Mg 1.5(L) Cbgs- 201, 135 x 24 hrs  AIC 4.4(l)-11/17  UOP-   NUTRITION - FOCUSED PHYSICAL EXAM:  Flowsheet Row Most Recent Value  Orbital Region Moderate depletion  Upper Arm Region Severe depletion  Thoracic and Lumbar Region Severe depletion  Buccal Region Severe depletion  Temple Region Severe depletion   Clavicle Bone Region Severe depletion  Clavicle and Acromion Bone Region Severe depletion  Scapular Bone Region Severe depletion  Dorsal Hand Severe depletion  Patellar Region Severe depletion  Anterior Thigh Region Severe depletion  Posterior Calf Region Severe depletion  Edema (RD Assessment) None  Hair Reviewed  Eyes Reviewed  Mouth Reviewed  Skin Reviewed  Nails Reviewed   Diet Order:   Diet Order             Diet Carb Modified Fluid consistency: Thin; Room service appropriate? Yes  Diet effective now                  EDUCATION NEEDS:   Education needs have been addressed  Skin:  Skin Assessment: Reviewed RN Assessment  Last BM:  11/17- TYPE 6  Height:   Ht Readings from Last 1 Encounters:  10/28/24 6' 1 (1.854 m)    Weight:   Wt Readings from Last 1 Encounters:  10/31/24 73.2 kg    Ideal Body Weight:  83.6 kg  BMI:  Body mass index is 21.29 kg/m.  Estimated Nutritional Needs:   Kcal:  2100-2400kcal/day  Protein:  105-120g/day  Fluid:  2.0-2.3L/day  Augustin Shams MS, RD, LDN If unable to be reached, please send secure chat to RD inpatient available from 8:00a-4:00p daily

## 2024-10-31 NOTE — Progress Notes (Signed)
 Occupational Therapy Treatment Patient Details Name: Xavier Moore MRN: [id number] DOB: 09-10-1946 Today's Date: 10/31/2024   History of present illness Xavier Moore is a 78 year old male with history of hypertension, non-insulin-dependent diabetes mellitus, BPH, GERD, who presents ED for chief concerns of weakness, increasing fall and low appetite over the last 3 to 4 days.   OT comments  Patient seen for OT treatment on this date. Upon arrival to room patient resting in bed, agreeable to treatment. Patient transitioned to EOB with no A. OT provided handout and yellow theraband for UB HEP; OT faciliated/demonstrated exercises with patient able to return demo (see below for list of exercises). Patient performed toileting while standing at bedside; agreeable to transition to recliner; performed with S.  Patient ended treatment in recliner with bed/chair alarm on and all needs within reach. Patient making good progress toward goals, will continue to follow POC. Discharge recommendation remains appropriate.        If plan is discharge home, recommend the following:  A lot of help with bathing/dressing/bathroom;A little help with walking and/or transfers   Equipment Recommendations  Other (comment) (defer to next venue of care)    Recommendations for Other Services      Precautions / Restrictions Precautions Precautions: Fall Recall of Precautions/Restrictions: Intact Restrictions Weight Bearing Restrictions Per Provider Order: No       Mobility Bed Mobility Overal bed mobility: Needs Assistance Bed Mobility: Rolling, Supine to Sit Rolling: Modified independent (Device/Increase time)   Supine to sit: Modified independent (Device/Increase time) Sit to supine: Modified independent (Device/Increase time)   General bed mobility comments: able to transfer from supine to EOB with bed flat and no hand rails    Transfers Overall transfer level: Needs assistance Equipment  used: Rolling walker (2 wheels) Transfers: Bed to chair/wheelchair/BSC       Step pivot transfers: Supervision     General transfer comment: no cues for safety, supervision provided     Balance Overall balance assessment: Needs assistance Sitting-balance support: Feet supported, No upper extremity supported Sitting balance-Leahy Scale: Normal     Standing balance support: No upper extremity supported, During functional activity Standing balance-Leahy Scale: Fair                             ADL either performed or assessed with clinical judgement   ADL                               Toileting- Clothing Manipulation and Hygiene: Supervision/safety;Sit to/from stand Toileting - Clothing Manipulation Details (indicate cue type and reason): standing at bedside using urinal            Extremity/Trunk Assessment Upper Extremity Assessment Upper Extremity Assessment: Generalized weakness   Lower Extremity Assessment Lower Extremity Assessment: Defer to PT evaluation        Vision       Perception     Praxis     Communication Communication Communication: No apparent difficulties   Cognition Arousal: Alert Behavior During Therapy: WFL for tasks assessed/performed Cognition: No apparent impairments                               Following commands: Intact        Cueing   Cueing Techniques: Verbal cues, Visual cues  Exercises Exercises: Other exercises Other Exercises  Other Exercises: yellow therband provided: 15 reps x 1 set horizontal abduction, shoulder flexion bilaterally, bicep curls and scapular retraction; tolerated well; handout provided and patient agreeable to perform 1 set 2x a day    Shoulder Instructions       General Comments      Pertinent Vitals/ Pain       Pain Assessment Pain Assessment: No/denies pain  Home Living                                          Prior  Functioning/Environment              Frequency  Min 2X/week        Progress Toward Goals  OT Goals(current goals can now be found in the care plan section)  Progress towards OT goals: Progressing toward goals  Acute Rehab OT Goals Patient Stated Goal: to get stronger OT Goal Formulation: With patient Time For Goal Achievement: 11/12/24 Potential to Achieve Goals: Good ADL Goals Pt Will Perform Grooming: standing;Independently Pt Will Perform Lower Body Dressing: with modified independence;sit to/from stand Pt Will Transfer to Toilet: with modified independence;ambulating;regular height toilet  Plan      Co-evaluation                 AM-PAC OT 6 Clicks Daily Activity     Outcome Measure   Help from another person eating meals?: None Help from another person taking care of personal grooming?: A Little Help from another person toileting, which includes using toliet, bedpan, or urinal?: A Little Help from another person bathing (including washing, rinsing, drying)?: A Lot Help from another person to put on and taking off regular upper body clothing?: None Help from another person to put on and taking off regular lower body clothing?: A Little 6 Click Score: 19    End of Session    OT Visit Diagnosis: Other abnormalities of gait and mobility (R26.89);Muscle weakness (generalized) (M62.81)   Activity Tolerance     Patient Left     Nurse Communication          Time: 8575-8557 OT Time Calculation (min): 18 min  Charges: OT General Charges $OT Visit: 1 Visit OT Treatments $Therapeutic Exercise: 8-22 mins  Rogers Clause, OT/L MSOT, 10/31/2024

## 2024-11-01 DIAGNOSIS — N179 Acute kidney failure, unspecified: Secondary | ICD-10-CM | POA: Diagnosis not present

## 2024-11-01 LAB — CBC
HCT: 38.8 % — ABNORMAL LOW (ref 39.0–52.0)
Hemoglobin: 13.2 g/dL (ref 13.0–17.0)
MCH: 31.6 pg (ref 26.0–34.0)
MCHC: 34 g/dL (ref 30.0–36.0)
MCV: 92.8 fL (ref 80.0–100.0)
Platelets: 186 K/uL (ref 150–400)
RBC: 4.18 MIL/uL — ABNORMAL LOW (ref 4.22–5.81)
RDW: 12.6 % (ref 11.5–15.5)
WBC: 5.5 K/uL (ref 4.0–10.5)
nRBC: 0 % (ref 0.0–0.2)

## 2024-11-01 LAB — BASIC METABOLIC PANEL WITH GFR
Anion gap: 8 (ref 5–15)
BUN: 14 mg/dL (ref 8–23)
CO2: 27 mmol/L (ref 22–32)
Calcium: 8.9 mg/dL (ref 8.9–10.3)
Chloride: 103 mmol/L (ref 98–111)
Creatinine, Ser: 0.86 mg/dL (ref 0.61–1.24)
GFR, Estimated: >60 mL/min (ref >60)
Glucose, Bld: 144 mg/dL — ABNORMAL HIGH (ref 70–99)
Potassium: 4.3 mmol/L (ref 3.5–5.1)
Sodium: 138 mmol/L (ref 135–145)

## 2024-11-01 LAB — GLUCOSE, CAPILLARY
Glucose-Capillary: 257 mg/dL — ABNORMAL HIGH (ref 70–99)
Glucose-Capillary: 176 mg/dL — ABNORMAL HIGH (ref 70–99)
Glucose-Capillary: 102 mg/dL — ABNORMAL HIGH (ref 70–99)
Glucose-Capillary: 202 mg/dL — ABNORMAL HIGH (ref 70–99)

## 2024-11-01 LAB — MAGNESIUM: Magnesium: 1.8 mg/dL (ref 1.7–2.4)

## 2024-11-01 LAB — PHOSPHORUS: Phosphorus: 1.9 mg/dL — ABNORMAL LOW (ref 2.5–4.6)

## 2024-11-01 MED ORDER — VITAMIN B-12 1000 MCG PO TABS: 1000.0000 ug | ORAL_TABLET | Freq: Every day | ORAL | Status: DC

## 2024-11-01 MED ORDER — K PHOS MONO-SOD PHOS DI & MONO 155-852-130 MG PO TABS
500.0000 mg | ORAL_TABLET | Freq: Four times a day (QID) | ORAL | Status: AC
  Administered 2024-11-01 (×2): 500 mg via ORAL
  Filled 2024-11-01 (×3): qty 2

## 2024-11-01 MED ORDER — FOLIC ACID 1 MG PO TABS
1.0000 mg | ORAL_TABLET | Freq: Every day | ORAL | Status: DC
  Administered 2024-11-01 – 2024-11-02 (×2): 1 mg via ORAL
  Filled 2024-11-01 (×2): qty 1

## 2024-11-01 MED ORDER — LOSARTAN POTASSIUM 50 MG PO TABS
50.0000 mg | ORAL_TABLET | Freq: Every day | ORAL | Status: DC
  Administered 2024-11-02: 50 mg via ORAL
  Filled 2024-11-01: qty 1

## 2024-11-01 MED ORDER — CARVEDILOL 12.5 MG PO TABS
12.5000 mg | ORAL_TABLET | Freq: Two times a day (BID) | ORAL | Status: DC
  Administered 2024-11-01 – 2024-11-02 (×3): 12.5 mg via ORAL
  Filled 2024-11-01 (×3): qty 1

## 2024-11-01 MED ORDER — POTASSIUM PHOSPHATES 15 MMOLE/5ML IV SOLN: 30.0000 mmol | Freq: Once | INTRAVENOUS | Status: DC

## 2024-11-01 MED ORDER — LOSARTAN POTASSIUM 25 MG PO TABS
25.0000 mg | ORAL_TABLET | Freq: Once | ORAL | Status: AC
  Administered 2024-11-01: 25 mg via ORAL
  Filled 2024-11-01: qty 1

## 2024-11-01 MED ORDER — CYANOCOBALAMIN 1000 MCG/ML IJ SOLN
1000.0000 ug | Freq: Every day | INTRAMUSCULAR | Status: DC
Start: 2024-11-01 — End: 2024-11-02
  Administered 2024-11-02: 1000 ug via INTRAMUSCULAR
  Filled 2024-11-01 (×2): qty 1

## 2024-11-01 MED ORDER — VITAMIN D (ERGOCALCIFEROL) 1.25 MG (50000 UNIT) PO CAPS
50000.0000 [IU] | ORAL_CAPSULE | ORAL | Status: DC
  Administered 2024-11-01: 50000 [IU] via ORAL
  Filled 2024-11-01: qty 1

## 2024-11-01 NOTE — Progress Notes (Signed)
 Physical Therapy Treatment Patient Details Name: Xavier Moore MRN: [id number] DOB: 12-14-1945 Today's Date: 11/01/2024   History of Present Illness [name] is a 78 year old male with history of hypertension, non-insulin-dependent diabetes mellitus, BPH, GERD, who presents ED for chief concerns of weakness, increasing fall and low appetite over the last 3 to 4 days.    PT Comments  Arrived to pt sitting in recliner. Pt stated that he feels more comfortable with RW than with quad cane due to previous falls. Pt was A and O x4 and was willing to work with chartered loss adjuster. Monitored both O2 and HR during ambulation and both values were WNL. Performed STS to RW with CGA. Pt ambulated 320 ft with CGA. Presented having a step through pattern until towards the end where the pt began to slow cadence and fatigue. Performed stand to sit from RW to EOB with supervision A. Pt performed sit to supine with supervision A. Pt would still benefit from further therapy and improving balance. Discharge recs still apply. Continue with POC.    If plan is discharge home, recommend the following: A little help with walking and/or transfers;A lot of help with bathing/dressing/bathroom;Assistance with cooking/housework;Assistance with feeding;Help with stairs or ramp for entrance   Can travel by private vehicle     No  Equipment Recommendations       Recommendations for Other Services       Precautions / Restrictions Precautions Precautions: Fall Recall of Precautions/Restrictions: Intact Restrictions Weight Bearing Restrictions Per Provider Order: No     Mobility  Bed Mobility Overal bed mobility: Modified Independent Bed Mobility: Sit to Supine       Sit to supine: Contact guard assist        Transfers Overall transfer level: Needs assistance Equipment used: Rolling walker (2 wheels) Transfers: Sit to/from Stand Sit to Stand: Supervision           General transfer comment: Little  cueing needed for safety    Ambulation/Gait Ambulation/Gait assistance: Contact guard assist Gait Distance (Feet): 320 Feet Assistive device: Rolling walker (2 wheels) Gait Pattern/deviations: Step-to pattern, Step-through pattern       General Gait Details: Wanted to go another lap around after completing first lap. Gait was a consistent cadence but slowed to step to pattern towards the end due to fatigue.          Balance Overall balance assessment: Needs assistance Sitting-balance support: Feet supported, No upper extremity supported Sitting balance-Leahy Scale: Normal     Standing balance support: No upper extremity supported, During functional activity Standing balance-Leahy Scale: Fair Standing balance comment: Cueing to stand up straight          Communication Communication Communication: No apparent difficulties  Cognition Arousal: Alert Behavior During Therapy: WFL for tasks assessed/performed   PT - Cognitive impairments: No apparent impairments, No family/caregiver present to determine baseline     PT - Cognition Comments: A and Ox4 Following commands: Intact      Cueing Cueing Techniques: Verbal cues, Visual cues  Exercises      General Comments General comments (skin integrity, edema, etc.): Denied wanting to use quad cane due to fear of falls      Pertinent Vitals/Pain Pain Assessment Pain Assessment: No/denies pain     PT Goals (current goals can now be found in the care plan section) Acute Rehab PT Goals Patient Stated Goal: to get better Progress towards PT goals: Progressing toward goals    Frequency    Min  2X/week       AM-PAC PT 6 Clicks Mobility   Outcome Measure  Help needed turning from your back to your side while in a flat bed without using bedrails?: None Help needed moving from lying on your back to sitting on the side of a flat bed without using bedrails?: A Little Help needed moving to and from a bed to a chair  (including a wheelchair)?: A Little Help needed standing up from a chair using your arms (e.g., wheelchair or bedside chair)?: A Little Help needed to walk in hospital room?: A Little Help needed climbing 3-5 steps with a railing? : A Lot 6 Click Score: 18    End of Session Equipment Utilized During Treatment: Gait belt Activity Tolerance: Patient tolerated treatment well Patient left: in bed;with call bell/phone within reach;with bed alarm set Nurse Communication: Mobility status PT Visit Diagnosis: Unsteadiness on feet (R26.81);Muscle weakness (generalized) (M62.81)     Time: 8641-8583 PT Time Calculation (min) (ACUTE ONLY): 18 min  Charges:    $Gait Training: 8-22 mins PT General Charges $$ ACUTE PT VISIT: 1 Visit                     Velvie Thomaston SPTA    Destynie Toomey 11/01/2024, 2:57 PM

## 2024-11-01 NOTE — Progress Notes (Signed)
 Mobility Specialist - Progress Note   11/01/24 1139  Mobility  Activity Ambulated with assistance  Level of Assistance Contact guard assist, steadying assist  Assistive Device Front wheel walker  Distance Ambulated (ft) 24 ft  Activity Response Tolerated well  Mobility visit 1 Mobility  Mobility Specialist Start Time (ACUTE ONLY) 1116  Mobility Specialist Stop Time (ACUTE ONLY) 1138  Mobility Specialist Time Calculation (min) (ACUTE ONLY) 22 min   Pt supine upon entry, utilizing RA. Pt amb to/from the bathroom CGA, completing a bath w/ SBA. Pt amb to the recliner, left seated with alarm set and needs within reach.  Xavier Moore Mobility Specialist 11/01/24 11:41 AM

## 2024-11-01 NOTE — Plan of Care (Signed)

## 2024-11-01 NOTE — TOC Progression Note (Signed)
 Transition of Care [facility]) - Progression Note    Patient Details  Name: Joffre Lucks MRN: [id number] Date of Birth: 08/12/1946  [facility] CM/SW Contact  Corean ONEIDA Haddock, RN Phone Number: 11/01/2024, 3:39 PM  Clinical Narrative:     Notified by beverly with IP Care Management  Peer2Peer offer before 4 pm today, 11/01/24 [phone] give patient name, dob and tracking id # [id number].  MD notified                    Expected Discharge Plan and Services                                               Social Drivers of Health (SDOH) Interventions SDOH Screenings   Food Insecurity: Patient Declined (10/28/2024)  Housing: Unknown (10/28/2024)  Transportation Needs: Patient Declined (10/28/2024)  Utilities: Patient Declined (10/28/2024)  Financial Resource Strain: Medium Risk (05/02/2023)   Received from [facility]  Physical Activity: Insufficiently Active (05/02/2023)   Received from [facility]  Social Connections: Patient Declined (10/28/2024)  Stress: Stress Concern Present (05/02/2023)   Received from [facility]  Tobacco Use: Medium Risk (10/28/2024)    Readmission Risk Interventions     No data to display

## 2024-11-01 NOTE — Plan of Care (Signed)

## 2024-11-01 NOTE — Progress Notes (Signed)
 Progress Note   Patient: Xavier Moore FMW:[id number] DOB: [birth year] DOA: 10/28/2024     4 DOS: the patient was seen and examined on 11/01/2024   Brief hospital course: Mr. Jayd Cadieux is a 78 year old male with history of hypertension, non-insulin-dependent diabetes mellitus, BPH, GERD, who presents ED for chief concerns of weakness, increasing fall and low appetite over the last 3 to 4 days.  Vitals in the ED showed t of 98, rr 20, hr 97, blood pressure 142/97, SpO2 99% on room air. 5 7 sodium is 138, potassium 3.0, magnesium 0.7 chloride 94, bicarb 24, BUN of 39, serum creatinine 1.56, eGFR 45, nonfasting blood glucose 152, WBC 8.5, hemoglobin 17.2, platelet 235.  UA is negative for leukocytes, negative for nitrates.  COVID/influenza A/influenza B/RSV PCR were negative.  Electrolytes were ordered along with IV fluid.  11/17: Hemodynamically stable, labs with magnesium improved to 1.8, up potassium 3.4, creatinine improved to 1.11.  Giving more potassium and magnesium.  Will continue IV fluid for another day.  PT is recommending SNF.  Patient was without food for the past few days, becoming progressively weaker and called the EMS.  11/18: Vital stable, labs with hypophosphatemia at 1.4, hypokalemia 3.3 and borderline magnesium at 1.7-electrolytes are being repleted. TOC working on placement.  Assessment and Plan:  # AKI (acute kidney injury) Resolved.  No prior CKD Likely prerenal with dehydration as he was not eating and drinking for the past few days. - sCr 0.90 -Monitor renal function  Hypokalemia Potassium 3.3>3.7 -Replete more potassium Continue to monitor-  Hypophosphatemia Phosphorus of 1.4>2.0>1.9 - Replace phosphorous and monitor  Hypomagnesemia Magnesium 1.7>1.5 - Giving 2 more gram of magnesium -Continue to monitor  Weakness Suspect secondary to poor p.o. intake leading to acute kidney injury and hypokalemia Fall precaution, aspiration  precaution PT, OT -recommending SNF  Diabetes mellitus type 2, noninsulin dependent (HCC) CBG within goal, was on metformin at home -Continue with SSI  Benign prostatic hyperplasia with urinary obstruction Patient takes finasteride  5 mg daily, tamsulosin  0.4 mg capsule  Essential hypertension Blood pressure mildly elevated Patient was not taking any antihypertensives at home. -11/20 increased Losartan 50 mg p.o. day 11/20 increased Coreg 12.5 mg p.o. twice daily -As needed hydralazine Monitor BP and titrate medications accordingly  GERD (gastroesophageal reflux disease) Home PPI  Protein-calorie malnutrition, moderate Patient appears to be moderately severe malnourished.  Does not have good access to food. -Dietitian consult  # Vitamin D insufficient: Vit D level 23, started vitamin D 50,000 units p.o. weekly, follow with PCP to repeat vitamin D level after 3 to 6 months.  # Vitamin B12 level 260, goal >400, Started vitamin B12 1000 mcg IM injection daily during hospital stay, followed by oral supplement.  Follow-up PCP to repeat vitamin B12 level after 3 to 6 months.  # Folic acid level 7.6, started folic acid 1 mg p.o. daily to prevent deficiency.   Subjective: No significant events overnight.  Patient was lying comfortably on the recliner, stated that he is feeling fine, no any acute issues   Physical Exam: Vitals:   11/01/24 0453 11/01/24 0601 11/01/24 0857 11/01/24 1614  BP: (!) 147/101 (!) 144/79 (!) 154/91 (!) 175/95  Pulse: 84 78 95 74  Resp: 18  18 20   Temp: 99 F (37.2 C)  98.1 F (36.7 C) 97.9 F (36.6 C)  TempSrc:      SpO2: 100%  98% 100%  Weight:  67.2 kg    Height:  General.  Frail and malnourished elderly man, in no acute distress. Pulmonary.  Lungs clear bilaterally, normal respiratory effort. CV.  Regular rate and rhythm, no JVD, rub or murmur. Abdomen.  Soft, nontender, nondistended, BS positive. CNS.  Alert and oriented .  No focal  neurologic deficit. Extremities.  No edema,  pulses intact and symmetrical.   Data Reviewed: Prior data reviewed  Family Communication: Discussed with patient  Disposition: Status is: Inpatient Remains inpatient appropriate because: Severity of illness  Planned Discharge Destination: Skilled nursing facility  DVT prophylaxis.  Lovenox Time spent: 55 minutes  This record has been created using Conservation officer, historic buildings. Errors have been sought and corrected,but may not always be located. Such creation errors do not reflect on the standard of care.   Author: Elvan Sor, MD 11/01/2024 4:50 PM  For on call review [email or web address].

## 2024-11-02 DIAGNOSIS — N179 Acute kidney failure, unspecified: Secondary | ICD-10-CM | POA: Diagnosis not present

## 2024-11-02 LAB — CBC
HCT: 39.1 % (ref 39.0–52.0)
Hemoglobin: 13.1 g/dL (ref 13.0–17.0)
MCH: 31.4 pg (ref 26.0–34.0)
MCHC: 33.5 g/dL (ref 30.0–36.0)
MCV: 93.8 fL (ref 80.0–100.0)
Platelets: 207 K/uL (ref 150–400)
RBC: 4.17 MIL/uL — ABNORMAL LOW (ref 4.22–5.81)
RDW: 12.5 % (ref 11.5–15.5)
WBC: 5.7 K/uL (ref 4.0–10.5)
nRBC: 0 % (ref 0.0–0.2)

## 2024-11-02 LAB — GLUCOSE, CAPILLARY
Glucose-Capillary: 162 mg/dL — ABNORMAL HIGH (ref 70–99)
Glucose-Capillary: 210 mg/dL — ABNORMAL HIGH (ref 70–99)
Glucose-Capillary: 147 mg/dL — ABNORMAL HIGH (ref 70–99)

## 2024-11-02 LAB — BASIC METABOLIC PANEL WITH GFR
Anion gap: 12 (ref 5–15)
BUN: 15 mg/dL (ref 8–23)
CO2: 24 mmol/L (ref 22–32)
Calcium: 9.3 mg/dL (ref 8.9–10.3)
Chloride: 102 mmol/L (ref 98–111)
Creatinine, Ser: 0.87 mg/dL (ref 0.61–1.24)
GFR, Estimated: >60 mL/min (ref >60)
Glucose, Bld: 145 mg/dL — ABNORMAL HIGH (ref 70–99)
Potassium: 4.1 mmol/L (ref 3.5–5.1)
Sodium: 137 mmol/L (ref 135–145)

## 2024-11-02 LAB — MAGNESIUM: Magnesium: 1.6 mg/dL — ABNORMAL LOW (ref 1.7–2.4)

## 2024-11-02 LAB — PHOSPHORUS: Phosphorus: 2.4 mg/dL — ABNORMAL LOW (ref 2.5–4.6)

## 2024-11-02 MED ORDER — FOLIC ACID 1 MG PO TABS: 1.0000 mg | ORAL_TABLET | Freq: Every day | ORAL | Status: AC

## 2024-11-02 MED ORDER — VITAMIN D (ERGOCALCIFEROL) 1.25 MG (50000 UNIT) PO CAPS: 50000.0000 [IU] | ORAL_CAPSULE | ORAL | Status: AC

## 2024-11-02 MED ORDER — K PHOS MONO-SOD PHOS DI & MONO 155-852-130 MG PO TABS
500.0000 mg | ORAL_TABLET | Freq: Three times a day (TID) | ORAL | Status: DC
  Administered 2024-11-02: 500 mg via ORAL
  Filled 2024-11-02 (×3): qty 2

## 2024-11-02 MED ORDER — CARVEDILOL 12.5 MG PO TABS: 12.5000 mg | ORAL_TABLET | Freq: Two times a day (BID) | ORAL | Status: AC

## 2024-11-02 MED ORDER — MAGNESIUM SULFATE 2 GM/50ML IV SOLN
2.0000 g | Freq: Once | INTRAVENOUS | Status: AC
  Administered 2024-11-02: 2 g via INTRAVENOUS
  Filled 2024-11-02: qty 50

## 2024-11-02 MED ORDER — LOSARTAN POTASSIUM 50 MG PO TABS: 50.0000 mg | ORAL_TABLET | Freq: Every day | ORAL | Status: AC

## 2024-11-02 MED ORDER — CYANOCOBALAMIN 1000 MCG PO TABS: 1000.0000 ug | ORAL_TABLET | Freq: Every day | ORAL | Status: AC

## 2024-11-02 MED ORDER — MAGNESIUM OXIDE 400 MG PO TABS: 400.0000 mg | ORAL_TABLET | Freq: Two times a day (BID) | ORAL | Status: AC

## 2024-11-02 NOTE — Plan of Care (Signed)

## 2024-11-02 NOTE — TOC Progression Note (Signed)
 Transition of Care [facility]) - Progression Note    Patient Details  Name: Xavier Moore MRN: [id number] Date of Birth: 1946/11/02  Transition of Care [facility]) CM/SW Contact  Corean ONEIDA Haddock, RN Phone Number: 11/02/2024, 3:47 PM  Clinical Narrative:     Per MD peer to peer has been completed and auth is to be approved.  Will awaiting confirmed auth # prior to discharge to [facility].   Dena at [employer] updated,  she confirms patient can admit tomorrow pending shara Sill  [phone] Patient states that sister may be able to transport at discharge                      Expected Discharge Plan and Services                                               Social Drivers of Health (SDOH) Interventions SDOH Screenings   Food Insecurity: Patient Declined (10/28/2024)  Housing: Unknown (10/28/2024)  Transportation Needs: Patient Declined (10/28/2024)  Utilities: Patient Declined (10/28/2024)  Financial Resource Strain: Medium Risk (05/02/2023)   Received from [facility]  Physical Activity: Insufficiently Active (05/02/2023)   Received from [facility]  Social Connections: Patient Declined (10/28/2024)  Stress: Stress Concern Present (05/02/2023)   Received from [facility]  Tobacco Use: Medium Risk (10/28/2024)    Readmission Risk Interventions     No data to display

## 2024-11-02 NOTE — TOC Transition Note (Signed)
 Transition of Care [facility]) - Discharge Note   Patient Details  Name: Xavier Moore MRN: [id number] Date of Birth: 23-Oct-1946  Transition [facility] Care [facility]) CM/SW Contact:  Corean ONEIDA Haddock, RN Phone Number: 11/02/2024, 4:47 PM   Clinical Narrative:      Auth approved for [facility] approved 11/01/24 - 11/07/24 [id number]    Patient will DC un:[id number] Anticipated DC date: 11/02/24  Family notified: with patient permission notified sister Sports Coach by: Lavella Camp   Per MD patient ready for DC to . RN, patient, patient's family, and facility notified of DC. Discharge Summary sent to facility. RN given number for report. DC packet on chart. .   TOC signing off.         Patient Goals and CMS Choice            Discharge Placement                       Discharge Plan and Services Additional resources added to the After Visit Summary for                                       Social Drivers of Health (SDOH) Interventions SDOH Screenings   Food Insecurity: Patient Declined (10/28/2024)  Housing: Unknown (10/28/2024)  Transportation Needs: Patient Declined (10/28/2024)  Utilities: Patient Declined (10/28/2024)  Financial Resource Strain: Medium Risk (05/02/2023)   Received from [facility]  Physical Activity: Insufficiently Active (05/02/2023)   Received from [facility]  Social Connections: Patient Declined (10/28/2024)  Stress: Stress Concern Present (05/02/2023)   Received from [facility]  Tobacco Use: Medium Risk (10/28/2024)     Readmission Risk Interventions     No data to display

## 2024-11-02 NOTE — Progress Notes (Signed)
 Occupational Therapy Treatment Patient Details Name: Xavier Moore MRN: [id number] DOB: Jun 10, 1946 Today's Date: 11/02/2024   History of present illness [name] is a 78 year old male with history of hypertension, non-insulin -dependent diabetes mellitus, BPH, GERD, who presents ED for chief concerns of weakness, increasing fall and low appetite over the last 3 to 4 days.   OT comments  Patient seen for OT treatment on this date. Upon arrival to room patient resting in bed, agreeable to treatment. Patient transitioned EOB without A, donned socks and shoes without A; sit<>stand with SPV with r/w, ambulated to bathroom to perform toileting while standing with supervision; washed hands while standing at sink with SPV. Patient ambulated 150 feet with r/w with supervision.  Patient ended treatment in bed with bed/chair alarm on and all needs within reach. Patient making good progress toward goals, will continue to follow POC. Discharge recommendation remains appropriate.        If plan is discharge home, recommend the following:  A little help with walking and/or transfers;A little help with bathing/dressing/bathroom   Equipment Recommendations  Other (comment) (defer to next venue of care)    Recommendations for Other Services      Precautions / Restrictions Precautions Precautions: Fall Recall of Precautions/Restrictions: Intact Restrictions Weight Bearing Restrictions Per Provider Order: No       Mobility Bed Mobility Overal bed mobility: Modified Independent                  Transfers                         Balance Overall balance assessment: Needs assistance Sitting-balance support: Feet supported, No upper extremity supported Sitting balance-Leahy Scale: Normal     Standing balance support: No upper extremity supported, During functional activity Standing balance-Leahy Scale: Good                             ADL either  performed or assessed with clinical judgement   ADL Overall ADL's : Needs assistance/impaired     Grooming: Wash/dry hands;Supervision/safety;Standing                   Toilet Transfer: Supervision/safety;Rolling walker (2 wheels)   Toileting- Clothing Manipulation and Hygiene: Supervision/safety;Sit to/from stand         General ADL Comments: performed LB dressing, grooming and toileting with supervision    Extremity/Trunk Assessment Upper Extremity Assessment Upper Extremity Assessment: Overall WFL for tasks assessed            Vision       Perception     Praxis     Communication Communication Communication: No apparent difficulties   Cognition Arousal: Alert Behavior During Therapy: WFL for tasks assessed/performed                                 Following commands: Intact        Cueing   Cueing Techniques: Verbal cues, Visual cues  Exercises      Shoulder Instructions       General Comments      Pertinent Vitals/ Pain       Pain Assessment Pain Assessment: No/denies pain  Home Living  Prior Functioning/Environment              Frequency  Min 2X/week        Progress Toward Goals  OT Goals(current goals can now be found in the care plan section)  Progress towards OT goals: Progressing toward goals  Acute Rehab OT Goals Patient Stated Goal: to rest OT Goal Formulation: With patient Time For Goal Achievement: 11/12/24 Potential to Achieve Goals: Good ADL Goals Pt Will Perform Grooming: standing;Independently Pt Will Perform Lower Body Dressing: with modified independence;sit to/from stand Pt Will Transfer to Toilet: with modified independence;ambulating;regular height toilet  Plan      Co-evaluation                 AM-PAC OT 6 Clicks Daily Activity     Outcome Measure   Help from another person eating meals?: None Help from another  person taking care of personal grooming?: None Help from another person toileting, which includes using toliet, bedpan, or urinal?: A Little Help from another person bathing (including washing, rinsing, drying)?: A Little Help from another person to put on and taking off regular upper body clothing?: A Little Help from another person to put on and taking off regular lower body clothing?: A Little 6 Click Score: 20    End of Session Equipment Utilized During Treatment: Rolling walker (2 wheels)  OT Visit Diagnosis: Other abnormalities of gait and mobility (R26.89);Muscle weakness (generalized) (M62.81)   Activity Tolerance Patient tolerated treatment well   Patient Left in bed;with call bell/phone within reach;with bed alarm set   Nurse Communication          Time: 8882-8865 OT Time Calculation (min): 17 min  Charges: OT General Charges $OT Visit: 1 Visit OT Treatments $Self Care/Home Management : 8-22 mins  Rogers Clause, OT/L MSOT, 11/02/2024

## 2024-11-02 NOTE — Progress Notes (Signed)
 Progress Note   Patient: Xavier Moore FMW:[id number] DOB: March 11, 1946 DOA: 10/28/2024     5 DOS: the patient was seen and examined on 11/02/2024   Brief hospital course: [name] is a 78 year old male with history of hypertension, non-insulin -dependent diabetes mellitus, BPH, GERD, who presents ED for chief concerns of weakness, increasing fall and low appetite over the last 3 to 4 days.  Vitals in the ED showed t of 98, rr 20, hr 97, blood pressure 142/97, SpO2 99% on room air. 5 7 sodium is 138, potassium 3.0, magnesium  0.7 chloride 94, bicarb 24, BUN of 39, serum creatinine 1.56, eGFR 45, nonfasting blood glucose 152, WBC 8.5, hemoglobin 17.2, platelet 235.  UA is negative for leukocytes, negative for nitrates.  COVID/influenza A/influenza B/RSV PCR were negative.  Electrolytes were ordered along with IV fluid.  11/17: Hemodynamically stable, labs with magnesium  improved to 1.8, up potassium 3.4, creatinine improved to 1.11.  Giving more potassium and magnesium .  Will continue IV fluid for another day.  PT is recommending SNF.  Patient was without food for the past few days, becoming progressively weaker and called the EMS.  11/18: Vital stable, labs with hypophosphatemia at 1.4, hypokalemia 3.3 and borderline magnesium  at 1.7-electrolytes are being repleted. TOC working on placement.  Assessment and Plan:  # AKI (acute kidney injury) Resolved.  No prior CKD Likely prerenal with dehydration as he was not eating and drinking for the past few days. - sCr 0.90 -Monitor renal function  Hypokalemia Potassium 3.3>3.7 -Replete more potassium Continue to monitor-  Hypophosphatemia Phosphorus of 1.4>2.0>1.9> 2.4 - Replace phosphorous and monitor  Hypomagnesemia Magnesium  1.7>1.5> 1.6 - Giving 2 more gram of magnesium  -Continue to monitor  Weakness Suspect secondary to poor p.o. intake leading to acute kidney injury and hypokalemia Fall precaution, aspiration  precaution PT, OT -recommending SNF  Diabetes mellitus type 2, noninsulin dependent (HCC) CBG within goal, was on metformin at home -Continue with SSI  Benign prostatic hyperplasia with urinary obstruction Patient takes finasteride  5 mg daily, tamsulosin  0.4 mg capsule  Essential hypertension Blood pressure mildly elevated Patient was not taking any antihypertensives at home. -11/20 increased Losartan  50 mg p.o. day 11/20 increased Coreg  12.5 mg p.o. twice daily -As needed hydralazine  Monitor BP and titrate medications accordingly  GERD (gastroesophageal reflux disease) Home PPI  Protein-calorie malnutrition, moderate Patient appears to be moderately severe malnourished.  Does not have good access to food. -Dietitian consult  # Vitamin D  insufficient: Vit D level 23, started vitamin D  50,000 units p.o. weekly, follow with PCP to repeat vitamin D  level after 3 to 6 months.  # Vitamin B12 level 260, goal >400, Started vitamin B12 1000 mcg IM injection daily during hospital stay, followed by oral supplement.  Follow-up PCP to repeat vitamin B12 level after 3 to 6 months.  # Folic acid  level 7.6, started folic acid  1 mg p.o. daily to prevent deficiency.   Subjective: No significant events overnight.  Patient was lying comfortably in the bed.  Overall he is feeling better, denied any active issues.  No complaints   Physical Exam: Vitals:   11/02/24 0426 11/02/24 0500 11/02/24 0816 11/02/24 1602  BP: 136/81  129/87 (!) 159/92  Pulse: 78  73 72  Resp: 16  16 18   Temp: 97.8 F (36.6 C)  98.5 F (36.9 C) 97.7 F (36.5 C)  TempSrc: Oral   Oral  SpO2: 98%  98% 100%  Weight:  69.8 kg    Height:  General.  Frail and malnourished elderly man, in no acute distress. Pulmonary.  Lungs clear bilaterally, normal respiratory effort. CV.  Regular rate and rhythm, no JVD, rub or murmur. Abdomen.  Soft, nontender, nondistended, BS positive. CNS.  Alert and oriented .  No focal  neurologic deficit. Extremities.  No edema,  pulses intact and symmetrical.   Data Reviewed: Prior data reviewed  Family Communication: Discussed with patient  Disposition: Status is: Inpatient Remains inpatient appropriate because: Severity of illness  Planned Discharge Destination: Skilled nursing facility 11/21 peer-to-peer was done and patient got approved.  Awaiting for official approval letter, most likely patient will be discharged to SNF tomorrow a.m.   DVT prophylaxis.  Lovenox  Time spent: 40 minutes  This record has been created using Conservation officer, historic buildings. Errors have been sought and corrected,but may not always be located. Such creation errors do not reflect on the standard of care.   Author: Elvan Sor, MD 11/02/2024 4:18 PM  For on call review [email or web address].

## 2024-11-02 NOTE — TOC Progression Note (Signed)
 Transition of Care [facility]) - Progression Note    Patient Details  Name: Xavier Moore MRN: [id number] Date of Birth: May 14, 1946  [facility] CM/SW Contact  Corean ONEIDA Haddock, RN Phone Number: 11/02/2024, 1:34 PM  Clinical Narrative:        Per MD peer to peer scheduled today between 2-6pm, for authorization for [facility]                  Expected Discharge Plan and Services                                               Social Drivers of Health (SDOH) Interventions SDOH Screenings   Food Insecurity: Patient Declined (10/28/2024)  Housing: Unknown (10/28/2024)  Transportation Needs: Patient Declined (10/28/2024)  Utilities: Patient Declined (10/28/2024)  Financial Resource Strain: Medium Risk (05/02/2023)   Received from [facility]  Physical Activity: Insufficiently Active (05/02/2023)   Received from [facility]  Social Connections: Patient Declined (10/28/2024)  Stress: Stress Concern Present (05/02/2023)   Received from [facility]  Tobacco Use: Medium Risk (10/28/2024)    Readmission Risk Interventions     No data to display

## 2024-11-02 NOTE — Discharge Summary (Signed)
 [facility] Discharge Summary   Patient: Xavier [place] FMW:[id number]  PCP: [facility], [address]  Date of admission: 10/28/2024   Date of discharge:  11/02/2024     Discharge Diagnoses:  Principal Problem:   AKI (acute kidney injury) Active Problems:   Hypokalemia   Hypomagnesemia   Hypophosphatemia   Weakness   Diabetes mellitus type 2, noninsulin dependent (HCC)   Benign prostatic hyperplasia with urinary obstruction   Essential hypertension   GERD (gastroesophageal reflux disease)   Protein-calorie malnutrition, moderate   Protein-calorie malnutrition, severe   Admitted From: Home Disposition:  SNF   Recommendations for Outpatient Follow-up:  Follow with PCP, patient needs to be seen by an MD in 1 to 2 days, monitor BP and titrate medication accordingly. Repeat CBC, BMP, magnesium  and phosphorus level in 1 to 2 weeks Repeat vitamin D  level, vitamin B12 and folic acid  level in between 3 to 6 months Follow up LABS/TEST:  as above   Contact information for after-discharge care     Destination     [facility] .   Service: Skilled Nursing Contact information: [address] [place]  [address] [phone]             Home Medical Care     [facility] .   Service: Home Health Services Contact information: [address] [place]  [address] [phone]                    Diet recommendation: Cardiac diet  Activity: The patient is advised to gradually reintroduce usual activities, as tolerated  Discharge Condition: stable  Code Status: Full code   History of present illness: As per the H and P dictated on admission.  Hospital Course:  [name] is a 78 year old male with history of hypertension, non-insulin -dependent diabetes mellitus, BPH, GERD, who presents ED for chief concerns of weakness, increasing fall and low appetite over the last 3  to 4 days. Patient was without food for the past few days, becoming progressively weaker and called the EMS.    Vitals in the ED showed t of 98, rr 20, hr 97, blood pressure 142/97, SpO2 99% on room air. 5 7 sodium is 138, potassium 3.0, magnesium  0.7 chloride 94, bicarb 24, BUN of 39, serum creatinine 1.56, eGFR 45, nonfasting blood glucose 152, WBC 8.5, hemoglobin 17.2, platelet 235.   UA is negative for leukocytes, negative for nitrates.   COVID/influenza A/influenza B/RSV PCR were negative.   Electrolytes were ordered along with IV fluid.   Assessment and Plan:   # AKI (acute kidney injury): Resolved.  No prior CKD Likely prerenal with dehydration as he was not eating and drinking for the past few days. - sCr 0.87 wnl  # Hypokalemia: Potassium repleted and resolved # Hypophosphatemia: Phosphorus of 1.4>2.0>1.9> 2.4.  Phos repleted today.  Repeat phosphorus levels after 1 week  # Hypomagnesemia: Magnesium  1.7>1.5> 1.6, magnesium  repleted today IV.  Started on magnesium  oxide 400 mg p.o. twice daily.  Repeat magnesium  level after 1 week.  # Diabetes mellitus type 2, noninsulin dependent S/p NovoLog  sliding scale during hospital stay. Resumed metformin on discharge.  Monitor CBG and continue diabetic diet.  # Benign prostatic hyperplasia with urinary obstruction Continue finasteride  5 mg daily, tamsulosin  0.4 mg capsule   # Essential hypertension: Blood pressure mildly elevated Patient was taking lisinopril at home.  Started losartan  50 mg p.o. daily and Coreg   12.5 mg p.o. twice daily.  Monitor BP and titrate medications accordingly.   # GERD (gastroesophageal reflux disease): Continue Home PPI   # Protein-calorie malnutrition, moderate Patient appears to be moderately severe malnourished.  Does not have good access to food. -Dietitian consult   # Vitamin D  insufficient: Vit D level 23, started vitamin D  50,000 units p.o. weekly, follow with PCP to repeat vitamin D  level after  3 to 6 months.   # Vitamin B12 level 260, goal >400, Started vitamin B12 1000 mcg IM injection daily during hospital stay, followed by oral supplement.  Follow-up PCP to repeat vitamin B12 level after 3 to 6 months.   # Folic acid  level 7.6, started folic acid  1 mg p.o. daily to prevent deficiency.   # Weakness: Suspect secondary to poor p.o. intake leading to acute kidney injury and hypokalemia.  Continue fall precaution, aspiration precaution PT, OT -recommending SNF  Body mass index is 20.3 kg/m.  Nutrition Problem: Severe Malnutrition Etiology: social / environmental circumstances Nutrition Interventions:   Patient was seen by physical therapy, who recommended Therapy, SNF placement, which was arranged. On the day of the discharge the patient's vitals were stable, and no other acute medical condition were reported by patient. the patient was felt safe to be discharge at SNF with Therapy.  Consultants: None Procedures: None  Discharge Exam: General: Appear in no distress, Oral Mucosa Clear, moist. Cardiovascular: S1 and S2 Present, no Murmur, Respiratory: normal respiratory effort, Bilateral Air entry present and no Crackles, no wheezes Abdomen: Bowel Sound present, Soft and no tenderness. Extremities: no Pedal edema, no calf tenderness Neurology: alert and oriented to time, place, and person affect appropriate.  Filed Weights   10/31/24 0356 11/01/24 0601 11/02/24 0500  Weight: 73.2 kg 67.2 kg 69.8 kg   Vitals:   11/02/24 0816 11/02/24 1602  BP: 129/87 (!) 159/92  Pulse: 73 72  Resp: 16 18  Temp: 98.5 F (36.9 C) 97.7 F (36.5 C)  SpO2: 98% 100%    DISCHARGE MEDICATION: Allergies as of 11/02/2024   No Known Allergies      Medication List     STOP taking these medications    lisinopril 20 MG tablet Commonly known as: ZESTRIL       TAKE these medications    carvedilol  12.5 MG tablet Commonly known as: COREG  Take 1 tablet (12.5 mg total) by mouth 2  (two) times daily with a meal.   cyanocobalamin  1000 MCG tablet Take 1 tablet (1,000 mcg total) by mouth daily. Start taking on: November 08, 2024   finasteride  5 MG tablet Commonly known as: Proscar  Take 1 tablet (5 mg total) by mouth daily.   folic acid  1 MG tablet Commonly known as: FOLVITE  Take 1 tablet (1 mg total) by mouth daily. Start taking on: November 03, 2024   losartan  50 MG tablet Commonly known as: COZAAR  Take 1 tablet (50 mg total) by mouth daily. Start taking on: November 03, 2024   magnesium  oxide 400 MG tablet Commonly known as: MAG-OX Take 1 tablet (400 mg total) by mouth 2 (two) times daily.   metFORMIN 1000 MG tablet Commonly known as: GLUCOPHAGE Take 1 tablet by mouth daily with breakfast.   omeprazole 20 MG capsule Commonly known as: PRILOSEC Take 20 mg by mouth daily.   sildenafil  100 MG tablet Commonly known as: VIAGRA  Take 1 tablet (100 mg total) by mouth daily as needed for erectile dysfunction.   tamsulosin  0.4 MG Caps capsule Commonly known  as: FLOMAX  TAKE ONE CAPSULE BY MOUTH ONCE DAILY   Vitamin D  (Ergocalciferol ) 1.25 MG (50000 UNIT) Caps capsule Commonly known as: DRISDOL  Take 1 capsule (50,000 Units total) by mouth every 7 (seven) days. Start taking on: November 08, 2024       No Known Allergies Discharge Instructions     Call MD for:  difficulty breathing, headache or visual disturbances   Complete by: As directed    Call MD for:  extreme fatigue   Complete by: As directed    Call MD for:  persistant dizziness or light-headedness   Complete by: As directed    Call MD for:  persistant nausea and vomiting   Complete by: As directed    Call MD for:  severe uncontrolled pain   Complete by: As directed    Call MD for:  temperature >100.4   Complete by: As directed    Diet - low sodium heart healthy   Complete by: As directed    Discharge instructions   Complete by: As directed    Follow with PCP, patient needs to be seen  by an MD in 1 to 2 days, monitor BP and titrate medication accordingly. Repeat CBC, BMP, magnesium  and phosphorus level in 1 to 2 weeks Repeat vitamin D  level, vitamin B12 and folic acid  level in between 3 to 6 months.   Increase activity slowly   Complete by: As directed        The results of significant diagnostics from this hospitalization (including imaging, microbiology, ancillary and laboratory) are listed below for reference.    Significant Diagnostic Studies: DG Chest Port 1 View Result Date: 10/28/2024 CLINICAL DATA:  Weakness and shortness of breath. EXAM: PORTABLE CHEST 1 VIEW COMPARISON:  10/30/2014. FINDINGS: The heart size and mediastinal contours are within normal limits. There is hilar fullness bilaterally. Lung volumes are low with mild atelectasis at the lung bases. No effusion or pneumothorax is seen. Old healed rib fractures are present on the right. No acute osseous abnormality. IMPRESSION: 1. Mild atelectasis at the lung bases. 2. Hilar fullness bilaterally, which may be due to enlarged lymph nodes, vasculature or overlapping densities. Short-term follow-up chest radiograph is recommended. Electronically Signed   By: Leita Birmingham M.D.   On: 10/28/2024 14:28   CT Cervical Spine Wo Contrast Result Date: 10/28/2024 CLINICAL DATA:  Neck trauma (Age >= 65y) Several falls this week with decreased appetite. Weakness and dizziness. EXAM: CT CERVICAL SPINE WITHOUT CONTRAST TECHNIQUE: Multidetector CT imaging of the cervical spine was performed without intravenous contrast. Multiplanar CT image reconstructions were also generated. RADIATION DOSE REDUCTION: This exam was performed according to the departmental dose-optimization program which includes automated exposure control, adjustment of the mA and/or kV according to patient size and/or use of iterative reconstruction technique. COMPARISON:  None Available. FINDINGS: Alignment: Straightening without focal angulation or significant  listhesis. Skull base and vertebrae: No evidence of acute cervical spine fracture or traumatic subluxation. Soft tissues and spinal canal: No prevertebral fluid or swelling. No visible canal hematoma. Disc levels: Multilevel spondylosis with disc space narrowing and uncinate spurring most advanced from C3-4 through C6-7. Up to moderate spinal stenosis at C6-7. Multilevel mild to moderate osseous foraminal narrowing. No large disc herniation identified. Upper chest: Biapical scarring. There is a 9 mm irregular nodule at right apex on image 78/2, favored to reflect scarring. There is a right thyroid nodule which is incompletely visualized, measuring up to 2.3 cm on image 80/3. Other: Bilateral carotid atherosclerosis. IMPRESSION:  1. No evidence of acute cervical spine fracture, traumatic subluxation or static signs of instability. 2. Multilevel cervical spondylosis with up to moderate spinal stenosis at C6-7. 3. 9 mm irregular nodule at the right lung apex, favored to reflect scarring. Consider follow-up chest CT in 6 months to assess stability. 4. 2.3 cm incidental right thyroid nodule. Recommend further evaluation with thyroid ultrasound unless contraindicated by the patient's comorbidities or limited life expectancy.(Ref: J Am Coll Radiol. 2015 Feb;12(2): 143-50). Electronically Signed   By: Elsie Perone M.D.   On: 10/28/2024 11:12   CT HEAD WO CONTRAST Result Date: 10/28/2024 EXAM: CT HEAD WITHOUT CONTRAST 10/28/2024 10:52:42 AM TECHNIQUE: CT of the head was performed without the administration of intravenous contrast. Automated exposure control, iterative reconstruction, and/or weight based adjustment of the mA/kV was utilized to reduce the radiation dose to as low as reasonably achievable. COMPARISON: None available. CLINICAL HISTORY: Head trauma, moderate-severe. FINDINGS: BRAIN AND VENTRICLES: Moderate atrophy and white matter changes are noted bilaterally. No acute hemorrhage. No evidence of acute  infarct. No hydrocephalus. No extra-axial collection. No mass effect or midline shift. ORBITS: No acute abnormality. SINUSES: No acute abnormality. SOFT TISSUES AND SKULL: No acute soft tissue abnormality. No skull fracture. IMPRESSION: 1. No acute intracranial abnormality. 2. Moderate cerebral atrophy and bilateral white matter changes, chronic-appearing. Electronically signed by: Lonni Necessary MD 10/28/2024 11:11 AM EST RP Workstation: [id number]    Microbiology: Recent Results (from the past 240 hours)  Resp panel by RT-PCR (RSV, Flu A&B, Covid) Anterior Nasal Swab     Status: None   Collection Time: 10/28/24 11:35 AM   Specimen: Anterior Nasal Swab  Result Value Ref Range Status   SARS Coronavirus 2 by RT PCR NEGATIVE NEGATIVE Final    Comment: (NOTE) SARS-CoV-2 target nucleic acids are NOT DETECTED.  The SARS-CoV-2 RNA is generally detectable in upper respiratory specimens during the acute phase of infection. The lowest concentration of SARS-CoV-2 viral copies this assay can detect is 138 copies/mL. A negative result does not preclude SARS-Cov-2 infection and should not be used as the sole basis for treatment or other patient management decisions. A negative result may occur with  improper specimen collection/handling, submission of specimen other than nasopharyngeal swab, presence of viral mutation(s) within the areas targeted by this assay, and inadequate number of viral copies(<138 copies/mL). A negative result must be combined with clinical observations, patient history, and epidemiological information. The expected result is Negative.  Fact Sheet for Patients:  bloggercourse.com  Fact Sheet for Healthcare Providers:  seriousbroker.it  This test is no t yet approved or cleared by the [place]  FDA and  has been authorized for detection and/or diagnosis of SARS-CoV-2 by FDA under an Emergency Use Authorization (EUA).  This EUA will remain  in effect (meaning this test can be used) for the duration of the COVID-19 declaration under Section 564(b)(1) of the Act, 21 U.S.C.section 360bbb-3(b)(1), unless the authorization is terminated  or revoked sooner.       Influenza A by PCR NEGATIVE NEGATIVE Final   Influenza B by PCR NEGATIVE NEGATIVE Final    Comment: (NOTE) The Xpert Xpress SARS-CoV-2/FLU/RSV plus assay is intended as an aid in the diagnosis of influenza from Nasopharyngeal swab specimens and should not be used as a sole basis for treatment. Nasal washings and aspirates are unacceptable for Xpert Xpress SARS-CoV-2/FLU/RSV testing.  Fact Sheet for Patients: bloggercourse.com  Fact Sheet for Healthcare Providers: seriousbroker.it  This test is not yet approved or cleared by  the [place]  FDA and has been authorized for detection and/or diagnosis of SARS-CoV-2 by FDA under an Emergency Use Authorization (EUA). This EUA will remain in effect (meaning this test can be used) for the duration of the COVID-19 declaration under Section 564(b)(1) of the Act, 21 U.S.C. section 360bbb-3(b)(1), unless the authorization is terminated or revoked.     Resp Syncytial Virus by PCR NEGATIVE NEGATIVE Final    Comment: (NOTE) Fact Sheet for Patients: bloggercourse.com  Fact Sheet for Healthcare Providers: seriousbroker.it  This test is not yet approved or cleared by the [place]  FDA and has been authorized for detection and/or diagnosis of SARS-CoV-2 by FDA under an Emergency Use Authorization (EUA). This EUA will remain in effect (meaning this test can be used) for the duration of the COVID-19 declaration under Section 564(b)(1) of the Act, 21 U.S.C. section 360bbb-3(b)(1), unless the authorization is terminated or revoked.  Performed at [facility], [address]  [place], [place] [address]      Labs: CBC: Recent Labs  Lab 10/28/24 1032 10/29/24 0529 10/30/24 0552 11/01/24 0542 11/02/24 0457  WBC 8.5 5.0 4.9 5.5 5.7  HGB 17.2* 13.1 12.8* 13.2 13.1  HCT 49.2 38.7* 36.5* 38.8* 39.1  MCV 90.8 93.7 91.0 92.8 93.8  PLT 235 176 161 186 207   Basic Metabolic Panel: Recent Labs  Lab 10/28/24 1724 10/29/24 0529 10/30/24 0552 10/31/24 0551 11/01/24 0542 11/02/24 0457  NA  --  138 138 137 138 137  K  --  3.4* 3.3* 3.7 4.3 4.1  CL  --  104 105 103 103 102  CO2  --  25 25 27 27 24   GLUCOSE  --  123* 137* 112* 144* 145*  BUN  --  29* 11 14 14 15   CREATININE  --  1.11 0.79 0.90 0.86 0.87  CALCIUM  --  8.2* 8.4* 8.8* 8.9 9.3  MG  --  1.8 1.7 1.5* 1.8 1.6*  PHOS 2.6  --  1.4* 2.0* 1.9* 2.4*   Liver Function Tests: Recent Labs  Lab 10/28/24 1032 10/30/24 0552 10/31/24 0551  AST 16  --   --   ALT 12  --   --   ALKPHOS 62  --   --   BILITOT 1.5*  --   --   PROT 7.4  --   --   ALBUMIN 4.2 3.4* 3.5   No results for input(s): LIPASE, AMYLASE in the last 168 hours. No results for input(s): AMMONIA in the last 168 hours. Cardiac Enzymes: No results for input(s): CKTOTAL, CKMB, CKMBINDEX, TROPONINI in the last 168 hours. BNP (last 3 results) No results for input(s): BNP in the last 8760 hours. CBG: Recent Labs  Lab 11/01/24 1611 11/01/24 2106 11/02/24 0818 11/02/24 1133 11/02/24 1617  GLUCAP 102* 202* 162* 210* 147*    Time spent: 35 minutes  Signed:  Elvan Sor  [facility] 11/02/2024 4:36 PM

## 2025-01-07 ENCOUNTER — Ambulatory Visit: Admitting: Podiatry
